# Patient Record
Sex: Male | Born: 1950 | Race: White | Hispanic: No | Marital: Married | Smoking: Never smoker
Health system: Southern US, Community
[De-identification: ages and names within clinical notes are randomized; demographics above are authoritative.]

## PROBLEM LIST (undated history)

## (undated) DIAGNOSIS — B029 Zoster without complications: Secondary | ICD-10-CM

## (undated) DIAGNOSIS — I10 Essential (primary) hypertension: Secondary | ICD-10-CM

## (undated) HISTORY — DX: Essential (primary) hypertension: I10

## (undated) HISTORY — PX: NO PAST SURGERIES: SHX2092

## (undated) HISTORY — DX: Zoster without complications: B02.9

---

## 2011-10-11 ENCOUNTER — Other Ambulatory Visit: Payer: Self-pay | Admitting: Ophthalmology

## 2012-07-11 ENCOUNTER — Ambulatory Visit (INDEPENDENT_AMBULATORY_CARE_PROVIDER_SITE_OTHER): Payer: Self-pay | Admitting: Family Medicine

## 2012-07-11 VITALS — BP 233/112 | HR 79 | Temp 98.2°F | Wt 202.8 lb

## 2012-07-11 DIAGNOSIS — H6692 Otitis media, unspecified, left ear: Secondary | ICD-10-CM

## 2012-07-11 DIAGNOSIS — E8881 Metabolic syndrome: Secondary | ICD-10-CM

## 2012-07-11 DIAGNOSIS — H6690 Otitis media, unspecified, unspecified ear: Secondary | ICD-10-CM

## 2012-07-11 DIAGNOSIS — J039 Acute tonsillitis, unspecified: Secondary | ICD-10-CM

## 2012-07-11 DIAGNOSIS — I10 Essential (primary) hypertension: Secondary | ICD-10-CM

## 2012-07-11 MED ORDER — AMOXICILLIN 875 MG PO TABS: 875.0000 mg | ORAL_TABLET | Freq: Two times a day (BID) | ORAL | Status: DC

## 2012-07-11 MED ORDER — AMLODIPINE BESYLATE 5 MG PO TABS: 5.0000 mg | ORAL_TABLET | Freq: Every day | ORAL | Status: DC

## 2012-07-11 NOTE — Progress Notes (Signed)
Patient ID: [name], male   DOB: 05/06/1950, 62 y.o.   MRN: [id number] SUBJECTIVE: Chief Complaint  Patient presents with  . Acute Visit    c/o tonsils swollen discomfort left ear    HPI: Has had large tonsils for years. Dr Christell Constant had referred him to ENT and the specialist had said it was risky. Gets recurrent tonsillitis. Having left sided tonsil pain and left ear feels styffed up. Nose congested. No fever.. No headache .  PMH/PSH: reviewed/updated in Epic  SH/FH: reviewed/updated in Epic: mom died at 33 years old, dad died at 69 years of alzheimers disease  Allergies: reviewed/updated in Epic  Medications: reviewed/updated in Epic  Immunizations: reviewed/updated in Epic  ROS: As above in the HPI. All other systems are stable or negative.  OBJECTIVE: APPEARANCE:  Patient in no acute distress.The patient appeared well nourished and normally developed. Acyanotic. Waist:42 3/4 inches VITAL SIGNS:BP 233/112  Pulse 79  Temp(Src) 98.2 F (36.8 C) (Oral)  Wt 202 lb 12.8 oz (91.989 kg) WM Obese   SKIN: warm and  Dry without overt rashes, tattoos and scars  HEAD and Neck: without JVD, Head and scalp: normal Eyes:No scleral icterus. Fundi normal, eye movements normal. Ears: Auricle normal, canal normal, left Tympanic membrane is abnormal, effusion and gray appearing, insufflation absent motion on the left. Nose: normal Throat:tonsillar hypertrophyNeck & thyroid: normal  CHEST & LUNGS: Chest wall: normal Lungs: Clear  CVS: Reveals the PMI to be normally located. Regular rhythm, First and Second Heart sounds are normal,  absence of murmurs, rubs or gallops. Peripheral vasculature: Radial pulses: normal Dorsal pedis pulses: normal Posterior pulses: normal  ABDOMEN:  Appearance: obese Benign, no organomegaly, no masses, no Abdominal Aortic enlargement. No Guarding , no rebound. No Bruits. Bowel sounds: normal  RECTAL: N/A GU: N/A  EXTREMETIES:  nonedematous. Both Femoral and Pedal pulses are normal.  MUSCULOSKELETAL:  Spine: normal Joints: intact  NEUROLOGIC: oriented to time,place and person; nonfocal. Strength is normal Sensory is normal Reflexes are normal Cranial Nerves are normal.  ASSESSMENT: Acute tonsillitis - Plan: amoxicillin (AMOXIL) 875 MG tablet  Otitis media, left - Plan: amoxicillin (AMOXIL) 875 MG tablet  HTN (hypertension) - Plan: amLODipine (NORVASC) 5 MG tablet  Metabolic syndrome  PLAN: No orders of the defined types were placed in this encounter.   No results found for this or any previous visit. Meds ordered this encounter  Medications  . amoxicillin (AMOXIL) 875 MG tablet    Sig: Take 1 tablet (875 mg total) by mouth 2 (two) times daily.    Dispense:  20 tablet    Refill:  0  . amLODipine (NORVASC) 5 MG tablet    Sig: Take 1 tablet (5 mg total) by mouth daily.    Dispense:  30 tablet    Refill:  3   DASH Diet handout in the AVS.       Dr Woodroe Mode Recommendations  Diet and Exercise discussed with patient.  For nutrition information, I recommend books:  1).Eat to Live by Dr Monico Hoar. 2).Prevent and Reverse Heart Disease by Dr Suzzette Righter. 3) Dr Katherina Right Book: Reversing Diabetes  Exercise recommendations are:  If unable to walk, then the patient can exercise in a chair 3 times a day. By flapping arms like a bird gently and raising legs outwards to the front.  If ambulatory, the patient can go for walks for 30 minutes 3 times a week. Then increase the intensity and duration as tolerated.  Goal  is to try to attain exercise frequency to 5 times a week.  If applicable: Best to perform resistance exercises (machines or weights) 2 days a week and cardio type exercises 3 days per week.   Return in about 1 week (around 07/18/2012) for Recheck medical problems, recheck BP.   Khaliya Golinski P. [place] Charon, M.D.

## 2012-07-11 NOTE — Patient Instructions (Addendum)
Dr Woodroe Mode Recommendations  Diet and Exercise discussed with patient.  For nutrition information, I recommend books:  1).Eat to Live by Dr Monico Hoar. 2).Prevent and Reverse Heart Disease by Dr Suzzette Righter. 3) Dr Katherina Right Book: Reversing Diabetes  Exercise recommendations are:  If unable to walk, then the patient can exercise in a chair 3 times a day. By flapping arms like a bird gently and raising legs outwards to the front.  If ambulatory, the patient can go for walks for 30 minutes 3 times a week. Then increase the intensity and duration as tolerated.  Goal is to try to attain exercise frequency to 5 times a week.  If applicable: Best to perform resistance exercises (machines or weights) 2 days a week and cardio type exercises 3 days per week.   DASH Diet The DASH diet stands for "Dietary Approaches to Stop Hypertension." It is a healthy eating plan that has been shown to reduce high blood pressure (hypertension) in as little as 14 days, while also possibly providing other significant health benefits. These other health benefits include reducing the risk of breast cancer after menopause and reducing the risk of type 2 diabetes, heart disease, colon cancer, and stroke. Health benefits also include weight loss and slowing kidney failure in patients with chronic kidney disease.  DIET GUIDELINES  Limit salt (sodium). Your diet should contain less than 1500 mg of sodium daily.  Limit refined or processed carbohydrates. Your diet should include mostly whole grains. Desserts and added sugars should be used sparingly.  Include small amounts of heart-healthy fats. These types of fats include nuts, oils, and tub margarine. Limit saturated and trans fats. These fats have been shown to be harmful in the body. CHOOSING FOODS  The following food groups are based on a 2000 calorie diet. See your Registered Dietitian for individual calorie needs. Grains and Grain  Products (6 to 8 servings daily)  Eat More Often: Whole-wheat bread, brown rice, whole-grain or wheat pasta, quinoa, popcorn without added fat or salt (air popped).  Eat Less Often: White bread, white pasta, white rice, cornbread. Vegetables (4 to 5 servings daily)  Eat More Often: Fresh, frozen, and canned vegetables. Vegetables may be raw, steamed, roasted, or grilled with a minimal amount of fat.  Eat Less Often/Avoid: Creamed or fried vegetables. Vegetables in a cheese sauce. Fruit (4 to 5 servings daily)  Eat More Often: All fresh, canned (in natural juice), or frozen fruits. Dried fruits without added sugar. One hundred percent fruit juice ( cup [237 mL] daily).  Eat Less Often: Dried fruits with added sugar. Canned fruit in light or heavy syrup. [employer], Fish, and Poultry (2 servings or less daily. One serving is 3 to 4 oz [85-114 g]).  Eat More Often: Ninety percent or leaner ground beef, tenderloin, sirloin. Round cuts of beef, chicken breast, [place] breast. All fish. Grill, bake, or broil your meat. Nothing should be fried.  Eat Less Often/Avoid: Fatty cuts of meat, [place], or chicken leg, thigh, or wing. Fried cuts of meat or fish. Dairy (2 to 3 servings)  Eat More Often: Low-fat or fat-free milk, low-fat plain or light yogurt, reduced-fat or part-skim cheese.  Eat Less Often/Avoid: Milk (whole, 2%).Whole milk yogurt. Full-fat cheeses. Nuts, Seeds, and Legumes (4 to 5 servings per week)  Eat More Often: All without added salt.  Eat Less Often/Avoid: Salted nuts and seeds, canned beans with added salt. Fats and Sweets (limited)  Eat More  Often: Vegetable oils, tub margarines without trans fats, sugar-free gelatin. Mayonnaise and salad dressings.  Eat Less Often/Avoid: Coconut oils, palm oils, butter, stick margarine, cream, half and half, cookies, candy, pie. FOR MORE INFORMATION The Dash Diet Eating Plan: www.dashdiet.org Document Released: 01/10/2011 Document  Revised: 04/15/2011 Document Reviewed: 01/10/2011 [facility] Patient Information 2014 [place], [place].

## 2012-07-17 ENCOUNTER — Ambulatory Visit (INDEPENDENT_AMBULATORY_CARE_PROVIDER_SITE_OTHER): Payer: PRIVATE HEALTH INSURANCE | Admitting: Family Medicine

## 2012-07-17 ENCOUNTER — Encounter: Payer: Self-pay | Admitting: Family Medicine

## 2012-07-17 VITALS — BP 184/105 | HR 69 | Temp 97.8°F | Ht 71.0 in | Wt 202.0 lb

## 2012-07-17 DIAGNOSIS — I10 Essential (primary) hypertension: Secondary | ICD-10-CM

## 2012-07-17 MED ORDER — AMLODIPINE BESYLATE 10 MG PO TABS: 10.0000 mg | ORAL_TABLET | Freq: Every day | ORAL | Status: DC

## 2012-07-17 NOTE — Progress Notes (Signed)
  Subjective:    Patient ID: [name], male    DOB: 11-08-1950, 62 y.o.   MRN: [id number]  HPI    Review of Systems     Objective:   Physical Exam        Assessment & Plan:

## 2012-07-21 ENCOUNTER — Ambulatory Visit: Payer: PRIVATE HEALTH INSURANCE | Admitting: Family Medicine

## 2012-07-22 MED ORDER — AMLODIPINE BESYLATE 10 MG PO TABS: 10.0000 mg | ORAL_TABLET | Freq: Every day | ORAL | Status: DC

## 2012-07-22 NOTE — Addendum Note (Signed)
Addended by: Deatra Canter on: 07/22/2012 03:09 PM   Modules accepted: Orders, Medications

## 2012-07-22 NOTE — Progress Notes (Addendum)
  Subjective:    Patient ID: [name], male    DOB: 12/11/1950, 62 y.o.   MRN: [id number]  HPI This 62 y.o. male presents for evaluation of Hypertension. Patient has hx of htn and is having elevated BP.    Review of Systems No chest pain, SOB, HA, dizziness, vision change, N/V, diarrhea, constipation, dysuria, urinary urgency or frequency, myalgias, arthralgias or rash.     Objective:   Physical Exam Vital signs noted  Well developed well nourished male.  HEENT - Head atraumatic Normocephalic                Eyes - PERRLA, Conjuctiva - clear Sclera- Clear EOMI                Ears - EAC's Wnl TM's Wnl Gross Hearing WNL                Nose - Nares patent                 Throat - oropharanx wnl Respiratory - Lungs CTA bilateral Cardiac - RRR S1 and S2 without murmur GI - Abdomen soft Nontender and bowel sounds active x 4 Extremities - No edema. Neuro - Grossly intact.       Assessment & Plan:  Essential hypertension, benign -   Amlodipine 10mg  one po qd and follow up in one to two weeks or prn

## 2012-08-17 ENCOUNTER — Encounter: Payer: Self-pay | Admitting: Family Medicine

## 2012-08-17 ENCOUNTER — Ambulatory Visit (INDEPENDENT_AMBULATORY_CARE_PROVIDER_SITE_OTHER): Payer: PRIVATE HEALTH INSURANCE | Admitting: Family Medicine

## 2012-08-17 VITALS — BP 161/90 | HR 80 | Temp 97.5°F | Ht 71.0 in | Wt 202.6 lb

## 2012-08-17 DIAGNOSIS — H6983 Other specified disorders of Eustachian tube, bilateral: Secondary | ICD-10-CM

## 2012-08-17 DIAGNOSIS — H6980 Other specified disorders of Eustachian tube, unspecified ear: Secondary | ICD-10-CM

## 2012-08-17 DIAGNOSIS — I10 Essential (primary) hypertension: Secondary | ICD-10-CM

## 2012-08-17 MED ORDER — HYDROCHLOROTHIAZIDE 12.5 MG PO CAPS: 12.5000 mg | ORAL_CAPSULE | Freq: Every day | ORAL | Status: DC

## 2012-08-17 NOTE — Progress Notes (Signed)
  Subjective:    Patient ID: [name], male    DOB: 10-29-1950, 62 y.o.   MRN: [id number]  HPI This 62 y.o. male presents for evaluation of hypertension.  He was having elevated bp And his amlodipine was increased to 10mg  po qd and he states he is getting bp readings of 150/90's consistently at home.  He is also having some congestion And ear discomfort.  He has had OM and URI recently and was on abx's..   Review of Systems C/o ear discomfort.    No chest pain, SOB, HA, dizziness, vision change, N/V, diarrhea, constipation, dysuria, urinary urgency or frequency, myalgias, arthralgias or rash.  Objective:   Physical Exam Vital signs noted  Well developed well nourished male.  HEENT - Head atraumatic Normocephalic                Eyes - PERRLA, Conjuctiva - clear Sclera- Clear EOMI                Ears - EAC's Wnl TM's Wnl Gross Hearing WNL                Nose - Nares patent                 Throat - oropharanx wnl Respiratory - Lungs CTA bilateral Cardiac - RRR S1 and S2 without murmur GI - Abdomen soft Nontender and bowel sounds active x 4 Extremities - No edema. Neuro - Grossly intact.       Assessment & Plan:  Essential hypertension, benign - Plan: hydrochlorothiazide (MICROZIDE) 12.5 MG capsule Follow up in one month for recheck and bmp.  Eustachian tube dysfunction, bilateral - Nasonex Nasal spray 1-2 sprays per nostril qd and continue  Doing valsalva maneuver.

## 2012-09-17 ENCOUNTER — Ambulatory Visit (INDEPENDENT_AMBULATORY_CARE_PROVIDER_SITE_OTHER): Payer: PRIVATE HEALTH INSURANCE | Admitting: Family Medicine

## 2012-09-17 ENCOUNTER — Encounter: Payer: Self-pay | Admitting: Family Medicine

## 2012-09-17 VITALS — BP 137/82 | HR 70 | Temp 97.8°F | Ht 71.0 in | Wt 198.6 lb

## 2012-09-17 DIAGNOSIS — H6990 Unspecified Eustachian tube disorder, unspecified ear: Secondary | ICD-10-CM

## 2012-09-17 DIAGNOSIS — H6980 Other specified disorders of Eustachian tube, unspecified ear: Secondary | ICD-10-CM

## 2012-09-17 DIAGNOSIS — I10 Essential (primary) hypertension: Secondary | ICD-10-CM

## 2012-09-17 MED ORDER — FLUTICASONE PROPIONATE 50 MCG/ACT NA SUSP: 2.0000 | Freq: Every day | NASAL | Status: DC

## 2012-09-18 NOTE — Addendum Note (Signed)
Addended by: Deatra Canter on: 09/18/2012 08:07 AM   Modules accepted: Level of Service

## 2012-09-18 NOTE — Patient Instructions (Signed)

## 2012-09-18 NOTE — Progress Notes (Signed)
  Subjective:    Patient ID: [name], male    DOB: October 14, 1950, 62 y.o.   MRN: [id number]  HPI This 62 y.o. male presents for evaluation of establishment visit and hypertension.  He has Hx of cva and is accompanied by his daughter who states he has been out of his blood pressure Medicine for 3 months.  She states he in numb on his right side from CVA. Daughter states he May have had some more strokes. Patient is dysarthric.   Review of Systems C/o HTN No chest pain, SOB, HA, dizziness, vision change, N/V, diarrhea, constipation, dysuria, urinary urgency or frequency, myalgias, arthralgias or rash.     Objective:   Physical Exam Vital signs noted  Chronic ill appearing  male.  HEENT - Head atraumatic Normocephalic                Eyes - PERRLA, Conjuctiva - clear Sclera- Clear EOMI                Ears - EAC's Wnl TM's Wnl Gross Hearing WNL                Nose - Nares patent                 Throat - poor dentition Respiratory - Lungs CTA bilateral Cardiac - RRR S1 and S2 without murmur BP right arm 220/120       Assessment & Plan:  Essential hypertension, benign - Plan: BMP8+EGFR - Discuss with patient and daughter that he  Needs to go to ED for hypertensive crisis.   EMS responds and takes patient to the ER at [facility].  Eustachian tube dysfunction, unspecified laterality - Plan: fluticasone (FLONASE) 50 MCG/ACT nasal spray

## 2012-09-18 NOTE — Progress Notes (Signed)
  Subjective:    Patient ID: [name], male    DOB: 06/12/1950, 62 y.o.   MRN: [id number]  HPI Previous note was wrong patient.  This 62 y.o. male presents for evaluation of hypertension.  Patient recently was rx'd hctz 12.5mg  For HTN and is tolerating it well and is needing refill on flonase for ETD and rhinitis and needs BMP. His bp is doing better.   Review of Systems No chest pain, SOB, HA, dizziness, vision change, N/V, diarrhea, constipation, dysuria, urinary urgency or frequency, myalgias, arthralgias or rash.     Objective:   Physical Exam  Vital signs noted  Well developed well nourished male.  HEENT - Head atraumatic Normocephalic                Eyes - PERRLA, Conjuctiva - clear Sclera- Clear EOMI                Ears - EAC's Wnl TM's Wnl Gross Hearing WNL                Nose - Nares patent                 Throat - oropharanx wnl Respiratory - Lungs CTA bilateral Cardiac - RRR S1 and S2 without murmur GI - Abdomen soft Nontender and bowel sounds active x 4 Extremities - No edema. Neuro - Grossly intact.      Assessment & Plan:  Essential hypertension, benign - Plan: BMP8+EGFR.  Blood pressure is controlled and follow up in 3 months.  Eustachian tube dysfunction, unspecified laterality - Plan: fluticasone (FLONASE) 50 MCG/ACT nasal spray

## 2012-12-23 ENCOUNTER — Ambulatory Visit (INDEPENDENT_AMBULATORY_CARE_PROVIDER_SITE_OTHER): Payer: PRIVATE HEALTH INSURANCE | Admitting: Family Medicine

## 2012-12-23 DIAGNOSIS — I10 Essential (primary) hypertension: Secondary | ICD-10-CM

## 2013-01-11 NOTE — Patient Instructions (Signed)

## 2013-01-11 NOTE — Progress Notes (Signed)
   Subjective:    Patient ID: [name], male    DOB: [birth year], 62 y.o.   MRN: [id number]  HPI This 62 y.o. male presents for evaluation of periodic evaluation. He has hx Of hypertension.   Review of Systems No chest pain, SOB, HA, dizziness, vision change, N/V, diarrhea, constipation, dysuria, urinary urgency or frequency, myalgias, arthralgias or rash.     Objective:   Physical Exam Vital signs noted  Well developed well nourished male.  HEENT - Head atraumatic Normocephalic                Eyes - PERRLA, Conjuctiva - clear Sclera- Clear EOMI                Ears - EAC's Wnl TM's Wnl Gross Hearing WNL                Nose - Nares patent                 Throat - oropharanx wnl Respiratory - Lungs CTA bilateral Cardiac - RRR S1 and S2 without murmur GI - Abdomen soft Nontender and bowel sounds active x 4 Extremities - No edema. Neuro - Grossly intact.       Assessment & Plan:  Essential hypertension, benign Continue current blood pressure medicines  Deatra Canter FNP

## 2013-03-11 ENCOUNTER — Ambulatory Visit (INDEPENDENT_AMBULATORY_CARE_PROVIDER_SITE_OTHER): Payer: PRIVATE HEALTH INSURANCE | Admitting: Nurse Practitioner

## 2013-03-11 ENCOUNTER — Encounter: Payer: Self-pay | Admitting: Nurse Practitioner

## 2013-03-11 ENCOUNTER — Telehealth: Payer: Self-pay | Admitting: Family Medicine

## 2013-03-11 VITALS — BP 137/83 | HR 88 | Temp 101.0°F | Ht 71.0 in | Wt 198.0 lb

## 2013-03-11 DIAGNOSIS — R0989 Other specified symptoms and signs involving the circulatory and respiratory systems: Secondary | ICD-10-CM

## 2013-03-11 DIAGNOSIS — J101 Influenza due to other identified influenza virus with other respiratory manifestations: Secondary | ICD-10-CM

## 2013-03-11 DIAGNOSIS — J111 Influenza due to unidentified influenza virus with other respiratory manifestations: Secondary | ICD-10-CM

## 2013-03-11 LAB — POCT INFLUENZA A/B
INFLUENZA B, POC: POSITIVE
Influenza A, POC: NEGATIVE

## 2013-03-11 MED ORDER — OSELTAMIVIR PHOSPHATE 75 MG PO CAPS: 75.0000 mg | ORAL_CAPSULE | Freq: Two times a day (BID) | ORAL | Status: DC

## 2013-03-11 MED ORDER — HYDROCODONE-HOMATROPINE 5-1.5 MG/5ML PO SYRP: 5.0000 mL | ORAL_SOLUTION | Freq: Three times a day (TID) | ORAL | Status: DC | PRN

## 2013-03-11 NOTE — Telephone Encounter (Signed)
Bodyaches and cough. Appt scheduled for this afternoon.

## 2013-03-11 NOTE — Patient Instructions (Signed)

## 2013-03-11 NOTE — Progress Notes (Signed)
Subjective:    Patient ID: [name], male    DOB: [birth year], 63 y.o.   MRN: [id number]  HPI PAtient in c/o cough, congestion and body aches that started Tuesday evenng all the sudden- fever developed yesterday- OTC motrin for fever.    Review of Systems  Constitutional: Positive for fever and chills. Negative for appetite change.  HENT: Positive for congestion and rhinorrhea. Negative for sore throat and trouble swallowing.   Respiratory: Positive for cough (nonproductive).   Cardiovascular: Negative.   Genitourinary: Negative.   All other systems reviewed and are negative.       Objective:   Physical Exam  Constitutional: He is oriented to person, place, and time. He appears well-developed and well-nourished.  HENT:  Right Ear: Hearing, tympanic membrane, external ear and ear canal normal.  Left Ear: Hearing, tympanic membrane, external ear and ear canal normal.  Nose: Mucosal edema and rhinorrhea present. Right sinus exhibits no maxillary sinus tenderness and no frontal sinus tenderness. Left sinus exhibits no maxillary sinus tenderness and no frontal sinus tenderness.  Mouth/Throat: Uvula is midline, oropharynx is clear and moist and mucous membranes are normal.  Eyes: Pupils are equal, round, and reactive to light.  Neck: Normal range of motion. Neck supple.  Cardiovascular: Normal rate, regular rhythm and normal heart sounds.   Pulmonary/Chest: Effort normal and breath sounds normal.  Dry cough  Abdominal: Soft. Bowel sounds are normal.  Lymphadenopathy:    He has no cervical adenopathy.  Neurological: He is alert and oriented to person, place, and time.  Skin: Skin is warm and dry.  Psychiatric: He has a normal mood and affect. His behavior is normal. Judgment and thought content normal.    BP 137/83  Pulse 88  Temp(Src) 101 F (38.3 C) (Oral)  Ht 5\' 11"  (1.803 m)  Wt 198 lb (89.812 kg)  BMI 27.63 kg/m2  Results for orders placed in visit on 03/11/13    POCT INFLUENZA A/B      Result Value Range   Influenza A, POC Negative     Influenza B, POC Positive          Assessment & Plan:   1. Chest congestion   2. Influenza B    Meds ordered this encounter  Medications  . oseltamivir (TAMIFLU) 75 MG capsule    Sig: Take 1 capsule (75 mg total) by mouth 2 (two) times daily.    Dispense:  10 capsule    Refill:  0    Order Specific Question:  Supervising Provider    Answer:  Ernestina Penna [[id number]]  . HYDROcodone-homatropine (HYCODAN) 5-1.5 MG/5ML syrup    Sig: Take 5 mLs by mouth every 8 (eight) hours as needed for cough.    Dispense:  120 mL    Refill:  0    Order Specific Question:  Supervising Provider    Answer:  Ernestina Penna [[id number]]   1. Take meds as prescribed 2. Use a cool mist humidifier especially during the winter months and when heat has been humid. 3. Use saline nose sprays frequently 4. Saline irrigations of the nose can be very helpful if done frequently.  * 4X daily for 1 week*  * Use of a nettie pot can be helpful with this. Follow directions with this* 5. Drink plenty of fluids 6. Keep thermostat turn down low 7.For any cough or congestion  Use plain Mucinex- regular strength or max strength is fine   * Children- consult with Pharmacist  for dosing 8. For fever or aces or pains- take tylenol or ibuprofen appropriate for age and weight.  * for fevers greater than 101 orally you may alternate ibuprofen and tylenol every  3 hours.   Mary-Margaret Daphine DeutscherMartin, FNP

## 2013-03-25 ENCOUNTER — Encounter: Payer: Self-pay | Admitting: Family Medicine

## 2013-03-25 ENCOUNTER — Ambulatory Visit (INDEPENDENT_AMBULATORY_CARE_PROVIDER_SITE_OTHER): Payer: PRIVATE HEALTH INSURANCE | Admitting: Family Medicine

## 2013-03-25 VITALS — BP 119/73 | HR 54 | Temp 97.7°F | Ht 71.0 in | Wt 195.0 lb

## 2013-03-25 DIAGNOSIS — I10 Essential (primary) hypertension: Secondary | ICD-10-CM

## 2013-03-25 NOTE — Progress Notes (Signed)
   Subjective:    Patient ID: [name], male    DOB: [birth year], 63 y.o.   MRN: [id number]  HPI This 63 y.o. male presents for evaluation of hypertension.  He is feeling better and his bp Is better controlled.   Review of Systems No chest pain, SOB, HA, dizziness, vision change, N/V, diarrhea, constipation, dysuria, urinary urgency or frequency, myalgias, arthralgias or rash.     Objective:   Physical Exam  Vital signs noted  Well developed well nourished male.  HEENT - Head atraumatic Normocephalic                Eyes - PERRLA, Conjuctiva - clear Sclera- Clear EOMI                Ears - EAC's Wnl TM's Wnl Gross Hearing WNL                Nose - Nares patent                 Throat - oropharanx wnl Respiratory - Lungs CTA bilateral Cardiac - RRR S1 and S2 without murmur GI - Abdomen soft Nontender and bowel sounds active x 4 Extremities - No edema. Neuro - Grossly intact.      Assessment & Plan:  Essential hypertension, benign Continue current regimen and follow up in 6 months  Deatra CanterWilliam J Oxford FNP

## 2013-07-17 ENCOUNTER — Other Ambulatory Visit: Payer: Self-pay | Admitting: Family Medicine

## 2013-07-19 ENCOUNTER — Other Ambulatory Visit: Payer: Self-pay | Admitting: *Deleted

## 2013-07-19 DIAGNOSIS — I10 Essential (primary) hypertension: Secondary | ICD-10-CM

## 2013-07-19 MED ORDER — HYDROCHLOROTHIAZIDE 12.5 MG PO CAPS: 12.5000 mg | ORAL_CAPSULE | Freq: Every day | ORAL | Status: DC

## 2013-07-19 NOTE — Telephone Encounter (Signed)
Last refill 2/15. 

## 2013-07-19 NOTE — Telephone Encounter (Signed)
Last ov 2/29/15. Pt wants #90.

## 2013-08-12 ENCOUNTER — Other Ambulatory Visit: Payer: Self-pay | Admitting: *Deleted

## 2013-08-12 NOTE — Telephone Encounter (Signed)
Last ov 2/15. 

## 2013-08-13 MED ORDER — AMLODIPINE BESYLATE 10 MG PO TABS: 10.0000 mg | ORAL_TABLET | Freq: Every day | ORAL | Status: DC

## 2013-09-18 ENCOUNTER — Other Ambulatory Visit: Payer: Self-pay | Admitting: Family Medicine

## 2013-10-01 ENCOUNTER — Encounter: Payer: Self-pay | Admitting: Family Medicine

## 2013-10-01 ENCOUNTER — Ambulatory Visit (INDEPENDENT_AMBULATORY_CARE_PROVIDER_SITE_OTHER): Payer: PRIVATE HEALTH INSURANCE | Admitting: Family Medicine

## 2013-10-01 VITALS — BP 127/75 | HR 68 | Temp 98.5°F | Ht 71.0 in | Wt 201.0 lb

## 2013-10-01 DIAGNOSIS — M129 Arthropathy, unspecified: Secondary | ICD-10-CM

## 2013-10-01 DIAGNOSIS — J309 Allergic rhinitis, unspecified: Secondary | ICD-10-CM

## 2013-10-01 DIAGNOSIS — J302 Other seasonal allergic rhinitis: Secondary | ICD-10-CM

## 2013-10-01 DIAGNOSIS — M1990 Unspecified osteoarthritis, unspecified site: Secondary | ICD-10-CM

## 2013-10-01 DIAGNOSIS — I10 Essential (primary) hypertension: Secondary | ICD-10-CM

## 2013-10-01 MED ORDER — FLUTICASONE PROPIONATE 50 MCG/ACT NA SUSP: 2.0000 | Freq: Every day | NASAL | Status: DC

## 2013-10-01 MED ORDER — AMLODIPINE BESYLATE 10 MG PO TABS: 10.0000 mg | ORAL_TABLET | Freq: Every day | ORAL | Status: DC

## 2013-10-01 MED ORDER — LISINOPRIL 10 MG PO TABS: 10.0000 mg | ORAL_TABLET | Freq: Every day | ORAL | Status: DC

## 2013-10-01 MED ORDER — MELOXICAM 7.5 MG PO TABS: 7.5000 mg | ORAL_TABLET | Freq: Every day | ORAL | Status: DC

## 2013-10-01 MED ORDER — HYDROCHLOROTHIAZIDE 12.5 MG PO CAPS: 12.5000 mg | ORAL_CAPSULE | Freq: Every day | ORAL | Status: DC

## 2013-10-01 NOTE — Progress Notes (Signed)
   Subjective:    Patient ID: [name], male    DOB: [birth year], 62 y.o.   MRN: [id number]  HPI  This 63 y.o. male presents for evaluation of routine follow up.  He has hx of seasonal allergies And hypertension.  Review of Systems No chest pain, SOB, HA, dizziness, vision change, N/V, diarrhea, constipation, dysuria, urinary urgency or frequency, myalgias, arthralgias or rash.     Objective:   Physical Exam Vital signs noted  Well developed well nourished male.  HEENT - Head atraumatic Normocephalic                Eyes - PERRLA, Conjuctiva - clear Sclera- Clear EOMI                Ears - EAC's Wnl TM's Wnl Gross Hearing WNL                Nose - Nares patent                 Throat - oropharanx wnl Respiratory - Lungs CTA bilateral Cardiac - RRR S1 and S2 without murmur GI - Abdomen soft Nontender and bowel sounds active x 4 Extremities - No edema. Neuro - Grossly intact.       Assessment & Plan:  Essential hypertension, benign - Plan: amLODipine (NORVASC) 10 MG tablet, hydrochlorothiazide (MICROZIDE) 12.5 MG capsule, lisinopril (PRINIVIL,ZESTRIL) 10 MG tablet  Seasonal allergies - Plan: fluticasone (FLONASE) 50 MCG/ACT nasal spray  Arthritis - Plan: meloxicam (MOBIC) 7.5 MG tablet  Refuses labs Deatra Canter FNP

## 2013-11-08 ENCOUNTER — Other Ambulatory Visit: Payer: Self-pay | Admitting: Family Medicine

## 2013-12-29 ENCOUNTER — Telehealth: Payer: Self-pay | Admitting: Family Medicine

## 2013-12-29 NOTE — Telephone Encounter (Signed)
Appointment given for Friday with [name]. Daughter states that this pain has been going on for about 2 weeks.

## 2013-12-31 ENCOUNTER — Ambulatory Visit: Payer: PRIVATE HEALTH INSURANCE | Admitting: Family Medicine

## 2014-05-01 ENCOUNTER — Other Ambulatory Visit: Payer: Self-pay | Admitting: Family Medicine

## 2014-06-01 ENCOUNTER — Other Ambulatory Visit: Payer: Self-pay | Admitting: Family Medicine

## 2014-06-29 ENCOUNTER — Other Ambulatory Visit: Payer: Self-pay | Admitting: Family Medicine

## 2014-10-19 ENCOUNTER — Other Ambulatory Visit: Payer: Self-pay | Admitting: Nurse Practitioner

## 2014-10-19 ENCOUNTER — Other Ambulatory Visit: Payer: Self-pay

## 2014-10-19 DIAGNOSIS — I10 Essential (primary) hypertension: Secondary | ICD-10-CM

## 2014-10-19 MED ORDER — LISINOPRIL 10 MG PO TABS: 10.0000 mg | ORAL_TABLET | Freq: Every day | ORAL | Status: DC

## 2014-10-19 NOTE — Telephone Encounter (Signed)
Last seen 10/01/13  B Oxford  Requesting 90 day supply

## 2014-11-20 ENCOUNTER — Other Ambulatory Visit: Payer: Self-pay | Admitting: Family Medicine

## 2014-11-21 NOTE — Telephone Encounter (Signed)
Last seen 09/2013 

## 2014-12-22 ENCOUNTER — Other Ambulatory Visit: Payer: Self-pay

## 2014-12-22 DIAGNOSIS — I10 Essential (primary) hypertension: Secondary | ICD-10-CM

## 2014-12-26 ENCOUNTER — Other Ambulatory Visit: Payer: Self-pay | Admitting: Family Medicine

## 2015-01-28 ENCOUNTER — Other Ambulatory Visit: Payer: Self-pay | Admitting: Family Medicine

## 2015-02-03 ENCOUNTER — Other Ambulatory Visit: Payer: Self-pay | Admitting: *Deleted

## 2015-02-03 DIAGNOSIS — I10 Essential (primary) hypertension: Secondary | ICD-10-CM

## 2015-02-03 MED ORDER — LISINOPRIL 10 MG PO TABS: 10.0000 mg | ORAL_TABLET | Freq: Every day | ORAL | Status: DC

## 2015-02-03 MED ORDER — AMLODIPINE BESYLATE 10 MG PO TABS: 10.0000 mg | ORAL_TABLET | Freq: Every day | ORAL | Status: DC

## 2015-02-03 MED ORDER — HYDROCHLOROTHIAZIDE 12.5 MG PO CAPS: 12.5000 mg | ORAL_CAPSULE | Freq: Every day | ORAL | Status: DC

## 2015-02-03 NOTE — Telephone Encounter (Signed)
Has appt next Friday,

## 2015-02-06 ENCOUNTER — Other Ambulatory Visit: Payer: Self-pay | Admitting: Nurse Practitioner

## 2015-02-10 ENCOUNTER — Ambulatory Visit (INDEPENDENT_AMBULATORY_CARE_PROVIDER_SITE_OTHER): Payer: PRIVATE HEALTH INSURANCE | Admitting: Family Medicine

## 2015-02-10 ENCOUNTER — Encounter: Payer: Self-pay | Admitting: Family Medicine

## 2015-02-10 VITALS — BP 130/76 | HR 104 | Temp 103.4°F | Ht 71.0 in | Wt 181.4 lb

## 2015-02-10 DIAGNOSIS — J101 Influenza due to other identified influenza virus with other respiratory manifestations: Secondary | ICD-10-CM

## 2015-02-10 DIAGNOSIS — R6883 Chills (without fever): Secondary | ICD-10-CM

## 2015-02-10 DIAGNOSIS — I10 Essential (primary) hypertension: Secondary | ICD-10-CM

## 2015-02-10 DIAGNOSIS — R52 Pain, unspecified: Secondary | ICD-10-CM | POA: Diagnosis not present

## 2015-02-10 DIAGNOSIS — R509 Fever, unspecified: Secondary | ICD-10-CM | POA: Diagnosis not present

## 2015-02-10 LAB — POCT INFLUENZA A/B
Influenza A, POC: POSITIVE — AB
Influenza B, POC: NEGATIVE

## 2015-02-10 MED ORDER — HYDROCODONE-HOMATROPINE 5-1.5 MG/5ML PO SYRP: 5.0000 mL | ORAL_SOLUTION | Freq: Three times a day (TID) | ORAL | Status: DC | PRN

## 2015-02-10 MED ORDER — LISINOPRIL 10 MG PO TABS: 10.0000 mg | ORAL_TABLET | Freq: Every day | ORAL | Status: DC

## 2015-02-10 MED ORDER — AMLODIPINE BESYLATE 10 MG PO TABS: 10.0000 mg | ORAL_TABLET | Freq: Every day | ORAL | Status: DC

## 2015-02-10 MED ORDER — OSELTAMIVIR PHOSPHATE 75 MG PO CAPS: 75.0000 mg | ORAL_CAPSULE | Freq: Two times a day (BID) | ORAL | Status: DC

## 2015-02-10 MED ORDER — HYDROCHLOROTHIAZIDE 12.5 MG PO CAPS
12.5000 mg | ORAL_CAPSULE | Freq: Every day | ORAL | Status: DC
Start: 2015-02-10 — End: 2015-08-08

## 2015-02-10 NOTE — Patient Instructions (Signed)
Come back for fasting labs and a flu shot in 2 weeks  Influenza, Adult Influenza ("the flu") is a viral infection of the respiratory tract. It occurs more often in winter months because people spend more time in close contact with one another. Influenza can make you feel very sick. Influenza easily spreads from person to person (contagious). CAUSES  Influenza is caused by a virus that infects the respiratory tract. You can catch the virus by breathing in droplets from an infected person's cough or sneeze. You can also catch the virus by touching something that was recently contaminated with the virus and then touching your mouth, nose, or eyes. RISKS AND COMPLICATIONS You may be at risk for a more severe case of influenza if you smoke cigarettes, have diabetes, have chronic heart disease (such as heart failure) or lung disease (such as asthma), or if you have a weakened immune system. Elderly people and pregnant women are also at risk for more serious infections. The most common problem of influenza is a lung infection (pneumonia). Sometimes, this problem can require emergency medical care and may be life threatening. SIGNS AND SYMPTOMS  Symptoms typically last 4 to 10 days and may include:  Fever.  Chills.  Headache, body aches, and muscle aches.  Sore throat.  Chest discomfort and cough.  Poor appetite.  Weakness or feeling tired.  Dizziness.  Nausea or vomiting. DIAGNOSIS  Diagnosis of influenza is often made based on your history and a physical exam. A nose or throat swab test can be done to confirm the diagnosis. TREATMENT  In mild cases, influenza goes away on its own. Treatment is directed at relieving symptoms. For more severe cases, your health care provider may prescribe antiviral medicines to shorten the sickness. Antibiotic medicines are not effective because the infection is caused by a virus, not by bacteria. HOME CARE INSTRUCTIONS  Take medicines only as directed by  your health care provider.  Use a cool mist humidifier to make breathing easier.  Get plenty of rest until your temperature returns to normal. This usually takes 3 to 4 days.  Drink enough fluid to keep your urine clear or pale yellow.  Cover yourmouth and nosewhen coughing or sneezing,and wash your handswellto prevent thevirusfrom spreading.  Stay homefromwork orschool untilthe fever is gonefor at least 261full day. PREVENTION  An annual influenza vaccination (flu shot) is the best way to avoid getting influenza. An annual flu shot is now routinely recommended for all adults in the [place] SEEK MEDICAL CARE IF:  You experiencechest pain, yourcough worsens,or you producemore mucus.  Youhave nausea,vomiting, ordiarrhea.  Your fever returns or gets worse. SEEK IMMEDIATE MEDICAL CARE IF:  You havetrouble breathing, you become short of breath,or your skin ornails becomebluish.  You have severe painor stiffnessin the neck.  You develop a sudden headache, or pain in the face or ear.  You have nausea or vomiting that you cannot control. MAKE SURE YOU:   Understand these instructions.  Will watch your condition.  Will get help right away if you are not doing well or get worse.   This information is not intended to replace advice given to you by your health care provider. Make sure you discuss any questions you have with your health care provider.   Document Released: 01/19/2000 Document Revised: 02/11/2014 Document Reviewed: 04/22/2011 Elsevier Interactive Patient Education [employer]2016 Elsevier Inc.

## 2015-02-10 NOTE — Progress Notes (Signed)
HPI  Patient presents today here for hypertension follow-up and acute visit.  Flulike symptoms Patient spines over the last 2 days he's had severe body aches, chills, malaise, and intermittent fever. He also has cough. He denies dyspnea and chest pain. He's tolerating food and fluids easily. 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  Hypertension Good medication compliance No chest pain, palpitations, leg edema, dyspnea   PMH: Smoking status noted ROS: Per HPI  Objective: BP 130/76 mmHg  Pulse 104  Temp(Src) 103.4 F (39.7 C) (Oral)  Ht 5' 11" (1.803 m)  Wt 181 lb 6.4 oz (82.283 kg)  BMI 25.31 kg/m2 Gen: NAD, alert, cooperative with exam HEENT: NCAT, conjunctiva injected CV: RRR, good S1/S2, no murmur Resp: CTABL, no wheezes, non-labored Ext: No edema, warm Neuro: Alert and oriented, No gross deficits  Assessment and plan:  # Influenza Rapid flu was positive for influenza a Treating with Tamiflu within 48 hours of onset Hycodan syrup given for cough and body aches. Recommended Tylenol Push fluids  # Hypertension Well-controlled Refilled medications Recommended fasting labs in 2 weeks when he feels better    Orders Placed This Encounter  Procedures  . CBC with Differential    Standing Status: Future     Number of Occurrences:      Standing Expiration Date: 02/10/2016  . CMP14+EGFR    Standing Status: Future     Number of Occurrences:      Standing Expiration Date: 02/10/2016  . Lipid Panel    Standing Status: Future     Number of Occurrences:      Standing Expiration Date: 02/10/2016  . TSH    Standing Status: Future     Number of Occurrences:      Standing Expiration Date: 02/10/2016  . POCT Influenza A/B    Meds ordered this encounter  Medications  . oseltamivir (TAMIFLU) 75 MG capsule    Sig: Take 1 capsule (75 mg total) by mouth 2 (two) times daily.   Dispense:  10 capsule    Refill:  0  . HYDROcodone-homatropine (HYCODAN) 5-1.5 MG/5ML syrup    Sig: Take 5 mLs by mouth every 8 (eight) hours as needed for cough.    Dispense:  120 mL    Refill:  0  . amLODipine (NORVASC) 10 MG tablet    Sig: Take 1 tablet (10 mg total) by mouth daily.    Dispense:  90 tablet    Refill:  1  . hydrochlorothiazide (MICROZIDE) 12.5 MG capsule    Sig: Take 1 capsule (12.5 mg total) by mouth daily.    Dispense:  90 capsule    Refill:  1  . lisinopril (PRINIVIL,ZESTRIL) 10 MG tablet    Sig: Take 1 tablet (10 mg total) by mouth daily.    Dispense:  90 tablet    Refill:  [place], MD [facility] 02/10/2015, 5:09 PM

## 2015-02-17 ENCOUNTER — Telehealth: Payer: Self-pay | Admitting: Family Medicine

## 2015-02-17 NOTE — Telephone Encounter (Signed)
It was too early, last filled 02/03/15, pt aware

## 2015-02-25 ENCOUNTER — Other Ambulatory Visit: Payer: PRIVATE HEALTH INSURANCE

## 2015-02-25 ENCOUNTER — Other Ambulatory Visit: Payer: Self-pay | Admitting: Family Medicine

## 2015-02-25 DIAGNOSIS — I10 Essential (primary) hypertension: Secondary | ICD-10-CM

## 2015-02-25 NOTE — Telephone Encounter (Signed)
Spoke with pt and there was a mix up at the pharmacy about when he could get a refill. Pt is calling the pharmacy and letting me know if he needs refill but from our end it looks like he should still have one more refill.

## 2015-02-25 NOTE — Telephone Encounter (Signed)
lmtcb

## 2015-02-26 LAB — CBC WITH DIFFERENTIAL/PLATELET
BASOS ABS: 0 10*3/uL (ref 0.0–0.2)
BASOS: 1 %
EOS (ABSOLUTE): 0.1 10*3/uL (ref 0.0–0.4)
Eos: 2 %
HEMOGLOBIN: 14.3 g/dL (ref 12.6–17.7)
Hematocrit: 41.3 % (ref 37.5–51.0)
IMMATURE GRANS (ABS): 0 10*3/uL (ref 0.0–0.1)
IMMATURE GRANULOCYTES: 0 %
LYMPHS: 22 %
Lymphocytes Absolute: 1.6 10*3/uL (ref 0.7–3.1)
MCH: 30.2 pg (ref 26.6–33.0)
MCHC: 34.6 g/dL (ref 31.5–35.7)
MCV: 87 fL (ref 79–97)
MONOCYTES: 8 %
Monocytes Absolute: 0.6 10*3/uL (ref 0.1–0.9)
NEUTROS ABS: 4.8 10*3/uL (ref 1.4–7.0)
NEUTROS PCT: 67 %
PLATELETS: 262 10*3/uL (ref 150–379)
RBC: 4.73 x10E6/uL (ref 4.14–5.80)
RDW: 13.5 % (ref 12.3–15.4)
WBC: 7.1 10*3/uL (ref 3.4–10.8)

## 2015-02-26 LAB — CMP14+EGFR
ALBUMIN: 4 g/dL (ref 3.6–4.8)
ALT: 15 IU/L (ref 0–44)
AST: 17 IU/L (ref 0–40)
Albumin/Globulin Ratio: 1.5 (ref 1.1–2.5)
Alkaline Phosphatase: 77 IU/L (ref 39–117)
BILIRUBIN TOTAL: 0.8 mg/dL (ref 0.0–1.2)
BUN / CREAT RATIO: 13 (ref 10–22)
BUN: 16 mg/dL (ref 8–27)
CALCIUM: 9.4 mg/dL (ref 8.6–10.2)
CHLORIDE: 101 mmol/L (ref 96–106)
CO2: 24 mmol/L (ref 18–29)
CREATININE: 1.19 mg/dL (ref 0.76–1.27)
GFR, EST AFRICAN AMERICAN: 75 mL/min/{1.73_m2} (ref >59)
GFR, EST NON AFRICAN AMERICAN: 65 mL/min/{1.73_m2} (ref >59)
GLUCOSE: 94 mg/dL (ref 65–99)
Globulin, Total: 2.6 g/dL (ref 1.5–4.5)
Potassium: 4.6 mmol/L (ref 3.5–5.2)
Sodium: 141 mmol/L (ref 134–144)
TOTAL PROTEIN: 6.6 g/dL (ref 6.0–8.5)

## 2015-02-26 LAB — TSH: TSH: 2.93 u[IU]/mL (ref 0.450–4.500)

## 2015-02-26 LAB — LIPID PANEL
CHOL/HDL RATIO: 3.4 ratio (ref 0.0–5.0)
Cholesterol, Total: 121 mg/dL (ref 100–199)
HDL: 36 mg/dL — AB (ref >39)
LDL CALC: 70 mg/dL (ref 0–99)
Triglycerides: 74 mg/dL (ref 0–149)
VLDL CHOLESTEROL CAL: 15 mg/dL (ref 5–40)

## 2015-08-07 ENCOUNTER — Other Ambulatory Visit: Payer: Self-pay | Admitting: Family Medicine

## 2015-08-08 ENCOUNTER — Other Ambulatory Visit: Payer: Self-pay | Admitting: Family Medicine

## 2015-11-03 ENCOUNTER — Other Ambulatory Visit: Payer: Self-pay | Admitting: Family Medicine

## 2015-11-07 ENCOUNTER — Other Ambulatory Visit: Payer: Self-pay | Admitting: Family Medicine

## 2016-01-31 ENCOUNTER — Other Ambulatory Visit: Payer: Self-pay | Admitting: Family Medicine

## 2016-02-03 ENCOUNTER — Other Ambulatory Visit: Payer: Self-pay | Admitting: Family Medicine

## 2016-05-04 ENCOUNTER — Other Ambulatory Visit: Payer: Self-pay | Admitting: Family Medicine

## 2016-08-07 ENCOUNTER — Other Ambulatory Visit: Payer: Self-pay | Admitting: Family Medicine

## 2016-08-08 ENCOUNTER — Telehealth: Payer: Self-pay | Admitting: Family Medicine

## 2016-08-08 MED ORDER — LISINOPRIL 10 MG PO TABS: ORAL_TABLET | ORAL | 0 refills | Status: DC

## 2016-08-08 MED ORDER — AMLODIPINE BESYLATE 10 MG PO TABS: ORAL_TABLET | ORAL | 0 refills | Status: DC

## 2016-08-08 MED ORDER — HYDROCHLOROTHIAZIDE 12.5 MG PO CAPS: ORAL_CAPSULE | ORAL | 0 refills | Status: DC

## 2016-08-08 NOTE — Telephone Encounter (Signed)
done

## 2016-08-22 ENCOUNTER — Encounter: Payer: Self-pay | Admitting: Family Medicine

## 2016-08-22 ENCOUNTER — Ambulatory Visit (INDEPENDENT_AMBULATORY_CARE_PROVIDER_SITE_OTHER): Payer: 59 | Admitting: Family Medicine

## 2016-08-22 VITALS — BP 135/79 | HR 71 | Temp 97.0°F | Ht 71.0 in | Wt 202.4 lb

## 2016-08-22 DIAGNOSIS — Z1211 Encounter for screening for malignant neoplasm of colon: Secondary | ICD-10-CM | POA: Diagnosis not present

## 2016-08-22 DIAGNOSIS — I10 Essential (primary) hypertension: Secondary | ICD-10-CM

## 2016-08-22 DIAGNOSIS — E8881 Metabolic syndrome: Secondary | ICD-10-CM | POA: Diagnosis not present

## 2016-08-22 MED ORDER — AMLODIPINE BESYLATE 10 MG PO TABS: ORAL_TABLET | ORAL | 3 refills | Status: DC

## 2016-08-22 MED ORDER — HYDROCHLOROTHIAZIDE 12.5 MG PO CAPS: ORAL_CAPSULE | ORAL | 3 refills | Status: DC

## 2016-08-22 MED ORDER — LISINOPRIL 10 MG PO TABS: ORAL_TABLET | ORAL | 3 refills | Status: DC

## 2016-08-22 NOTE — Progress Notes (Signed)
HPI  Patient presents today here to follow-up for hypertension and metabolic syndrome.  Hypertension Good medication compliance, blood pressure is usually 074 600 systolic at home. No headache, chest pain, dyspnea, palpitations.  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 No nocturia.  Watching his diet moderately. No formal exercise routine.  PMH: Smoking status noted ROS: Per HPI  Objective: BP 135/79   Pulse 71   Temp (!) 97 F (36.1 C) (Oral)   Ht 5' 11" (1.803 m)   Wt 202 lb 6.4 oz (91.8 kg)   BMI 28.23 kg/m  Gen: NAD, alert, cooperative with exam HEENT: NCAT CV: RRR, good S1/S2, no murmur Resp: CTABL, no wheezes, non-labored Ext: No edema, warm Neuro: Alert and oriented, No gross deficits  Assessment and plan:  # Hypertension Well-controlled on lisinopril plus HCTZ plus amlodipine, refill Labs Fasting for about 7 hours. Last ate at 9 AM  # Metabolic syndrome Weight up since last visit Patient concerned about his weight and watching diet.  # Screening for colon cancer Discussed colonoscopy at length, he declines, FOBT card given     Orders Placed This Encounter  Procedures  . Fecal occult blood, imunochemical    Standing Status:   Future    Standing Expiration Date:   08/22/2017  . CBC with Differential/Platelet  . CMP14+EGFR  . Lipid panel  . TSH    Meds ordered this encounter  Medications  . amLODipine (NORVASC) 10 MG tablet    Sig: TAKE 1 TABLET (10 MG TOTAL) BY MOUTH DAILY.    Dispense:  90 tablet    Refill:  3  . hydrochlorothiazide (MICROZIDE) 12.5 MG capsule    Sig: TAKE 1 CAPSULE (12.5 MG TOTAL) BY MOUTH DAILY.    Dispense:  90 capsule    Refill:  3  . lisinopril (PRINIVIL,ZESTRIL) 10 MG tablet    Sig: TAKE 1 TABLET (10 MG TOTAL) BY MOUTH DAILY.    Dispense:  90 tablet    Refill:  [place], MD [facility] 08/22/2016, 4:43 PM

## 2016-08-22 NOTE — Patient Instructions (Signed)
Great to see you!  Come back in 1 year unless you need us sooner.    

## 2016-08-23 ENCOUNTER — Other Ambulatory Visit: Payer: Self-pay | Admitting: Family Medicine

## 2016-08-23 DIAGNOSIS — R739 Hyperglycemia, unspecified: Secondary | ICD-10-CM

## 2016-08-23 DIAGNOSIS — R7989 Other specified abnormal findings of blood chemistry: Secondary | ICD-10-CM

## 2016-08-23 LAB — CMP14+EGFR
ALK PHOS: 79 IU/L (ref 39–117)
ALT: 34 IU/L (ref 0–44)
AST: 33 IU/L (ref 0–40)
Albumin/Globulin Ratio: 1.6 (ref 1.2–2.2)
Albumin: 4.2 g/dL (ref 3.6–4.8)
BILIRUBIN TOTAL: 0.5 mg/dL (ref 0.0–1.2)
BUN/Creatinine Ratio: 15 (ref 10–24)
BUN: 19 mg/dL (ref 8–27)
CHLORIDE: 105 mmol/L (ref 96–106)
CO2: 22 mmol/L (ref 20–29)
Calcium: 9.2 mg/dL (ref 8.6–10.2)
Creatinine, Ser: 1.29 mg/dL — ABNORMAL HIGH (ref 0.76–1.27)
GFR calc Af Amer: 68 mL/min/{1.73_m2} (ref >59)
GFR calc non Af Amer: 59 mL/min/{1.73_m2} — ABNORMAL LOW (ref >59)
GLUCOSE: 105 mg/dL — AB (ref 65–99)
Globulin, Total: 2.6 g/dL (ref 1.5–4.5)
POTASSIUM: 3.4 mmol/L — AB (ref 3.5–5.2)
Sodium: 144 mmol/L (ref 134–144)
Total Protein: 6.8 g/dL (ref 6.0–8.5)

## 2016-08-23 LAB — CBC WITH DIFFERENTIAL/PLATELET
BASOS ABS: 0.1 10*3/uL (ref 0.0–0.2)
Basos: 1 %
EOS (ABSOLUTE): 0.3 10*3/uL (ref 0.0–0.4)
Eos: 3 %
Hematocrit: 42.7 % (ref 37.5–51.0)
Hemoglobin: 14.5 g/dL (ref 13.0–17.7)
IMMATURE GRANS (ABS): 0 10*3/uL (ref 0.0–0.1)
Immature Granulocytes: 0 %
LYMPHS ABS: 1.9 10*3/uL (ref 0.7–3.1)
LYMPHS: 24 %
MCH: 30.2 pg (ref 26.6–33.0)
MCHC: 34 g/dL (ref 31.5–35.7)
MCV: 89 fL (ref 79–97)
MONOS ABS: 0.8 10*3/uL (ref 0.1–0.9)
Monocytes: 9 %
NEUTROS ABS: 5 10*3/uL (ref 1.4–7.0)
Neutrophils: 63 %
PLATELETS: 230 10*3/uL (ref 150–379)
RBC: 4.8 x10E6/uL (ref 4.14–5.80)
RDW: 13.9 % (ref 12.3–15.4)
WBC: 8 10*3/uL (ref 3.4–10.8)

## 2016-08-23 LAB — LIPID PANEL
CHOLESTEROL TOTAL: 115 mg/dL (ref 100–199)
Chol/HDL Ratio: 3.3 ratio (ref 0.0–5.0)
HDL: 35 mg/dL — AB (ref >39)
LDL Calculated: 57 mg/dL (ref 0–99)
Triglycerides: 115 mg/dL (ref 0–149)
VLDL CHOLESTEROL CAL: 23 mg/dL (ref 5–40)

## 2016-08-23 LAB — TSH: TSH: 7.63 u[IU]/mL — ABNORMAL HIGH (ref 0.450–4.500)

## 2016-11-04 ENCOUNTER — Other Ambulatory Visit: Payer: Self-pay | Admitting: Family Medicine

## 2017-11-13 ENCOUNTER — Other Ambulatory Visit: Payer: Self-pay | Admitting: *Deleted

## 2017-11-13 NOTE — Addendum Note (Signed)
Addended by: Julious Payer D on: 11/13/2017 11:01 AM   Modules accepted: Orders

## 2017-11-13 NOTE — Telephone Encounter (Signed)
NTBS Last OV 08/22/16

## 2017-11-21 ENCOUNTER — Telehealth: Payer: Self-pay | Admitting: *Deleted

## 2017-11-21 MED ORDER — AMLODIPINE BESYLATE 10 MG PO TABS: ORAL_TABLET | ORAL | 0 refills | Status: DC

## 2017-11-21 MED ORDER — LISINOPRIL 10 MG PO TABS: 10.0000 mg | ORAL_TABLET | Freq: Every day | ORAL | 0 refills | Status: DC

## 2017-11-21 MED ORDER — HYDROCHLOROTHIAZIDE 12.5 MG PO CAPS: ORAL_CAPSULE | ORAL | 0 refills | Status: DC

## 2017-11-21 NOTE — Telephone Encounter (Signed)
Pt aware refill sent into pharmacy 

## 2017-12-01 ENCOUNTER — Ambulatory Visit: Payer: 59 | Admitting: Family Medicine

## 2017-12-01 ENCOUNTER — Ambulatory Visit (INDEPENDENT_AMBULATORY_CARE_PROVIDER_SITE_OTHER): Payer: 59

## 2017-12-01 ENCOUNTER — Encounter: Payer: Self-pay | Admitting: Family Medicine

## 2017-12-01 VITALS — BP 154/81 | HR 77 | Temp 98.1°F | Ht 71.0 in | Wt 194.0 lb

## 2017-12-01 DIAGNOSIS — I10 Essential (primary) hypertension: Secondary | ICD-10-CM | POA: Diagnosis not present

## 2017-12-01 DIAGNOSIS — M25552 Pain in left hip: Secondary | ICD-10-CM

## 2017-12-01 DIAGNOSIS — E8881 Metabolic syndrome: Secondary | ICD-10-CM

## 2017-12-01 DIAGNOSIS — R351 Nocturia: Secondary | ICD-10-CM

## 2017-12-01 DIAGNOSIS — M160 Bilateral primary osteoarthritis of hip: Secondary | ICD-10-CM

## 2017-12-01 DIAGNOSIS — R590 Localized enlarged lymph nodes: Secondary | ICD-10-CM | POA: Diagnosis not present

## 2017-12-01 DIAGNOSIS — M25551 Pain in right hip: Secondary | ICD-10-CM | POA: Diagnosis not present

## 2017-12-01 DIAGNOSIS — R222 Localized swelling, mass and lump, trunk: Secondary | ICD-10-CM

## 2017-12-01 MED ORDER — AMLODIPINE BESYLATE 10 MG PO TABS: ORAL_TABLET | ORAL | 0 refills | Status: DC

## 2017-12-01 MED ORDER — LISINOPRIL 10 MG PO TABS: 10.0000 mg | ORAL_TABLET | Freq: Every day | ORAL | 0 refills | Status: DC

## 2017-12-01 MED ORDER — HYDROCHLOROTHIAZIDE 12.5 MG PO CAPS: ORAL_CAPSULE | ORAL | 0 refills | Status: DC

## 2017-12-01 NOTE — Patient Instructions (Signed)
Lymphadenopathy Lymphadenopathy refers to swollen or enlarged lymph glands, also called lymph nodes. Lymph glands are part of your body's defense (immune) system, which protects the body from infections, germs, and diseases. Lymph glands are found in many locations in your body, including the neck, underarm, and groin. Many things can cause lymph glands to become enlarged. When your immune system responds to germs, such as viruses or bacteria, infection-fighting cells and fluid build up. This causes the glands to grow in size. Usually, this is not something to worry about. The swelling and any soreness often go away without treatment. However, swollen lymph glands can also be caused by a number of diseases. Your health care provider may do various tests to help determine the cause. If the cause of your swollen lymph glands cannot be found, it is important to monitor your condition to make sure the swelling goes away. Follow these instructions at home: Watch your condition for any changes. The following actions may help to lessen any discomfort you are feeling:  Get plenty of rest.  Take medicines only as directed by your health care provider. Your health care provider may recommend over-the-counter medicines for pain.  Apply moist heat compresses to the site of swollen lymph nodes as directed by your health care provider. This can help reduce any pain.  Check your lymph nodes daily for any changes.  Keep all follow-up visits as directed by your health care provider. This is important.  Contact a health care provider if:  Your lymph nodes are still swollen after 2 weeks.  Your swelling increases or spreads to other areas.  Your lymph nodes are hard, seem fixed to the skin, or are growing rapidly.  Your skin over the lymph nodes is red and inflamed.  You have a fever.  You have chills.  You have fatigue.  You develop a sore throat.  You have abdominal pain.  You have weight  loss.  You have night sweats. Get help right away if:  You notice fluid leaking from the area of the enlarged lymph node.  You have severe pain in any area of your body.  You have chest pain.  You have shortness of breath. This information is not intended to replace advice given to you by your health care provider. Make sure you discuss any questions you have with your health care provider. Document Released: 10/31/2007 Document Revised: 06/29/2015 Document Reviewed: 08/26/2013 Elsevier Interactive Patient Education  2018 [employer]. Hypertension Hypertension is another name for high blood pressure. High blood pressure forces your heart to work harder to pump blood. This can cause problems over time. There are two numbers in a blood pressure reading. There is a top number (systolic) over a bottom number (diastolic). It is best to have a blood pressure below 120/80. Healthy choices can help lower your blood pressure. You may need medicine to help lower your blood pressure if:  Your blood pressure cannot be lowered with healthy choices.  Your blood pressure is higher than 130/80.  Follow these instructions at home: Eating and drinking  If directed, follow the DASH eating plan. This diet includes: ? Filling half of your plate at each meal with fruits and vegetables. ? Filling one quarter of your plate at each meal with whole grains. Whole grains include whole wheat pasta, brown rice, and whole grain bread. ? Eating or drinking low-fat dairy products, such as skim milk or low-fat yogurt. ? Filling one quarter of your plate at each meal with  low-fat (lean) proteins. Low-fat proteins include fish, skinless chicken, eggs, beans, and tofu. ? Avoiding fatty meat, cured and processed meat, or chicken with skin. ? Avoiding premade or processed food.  Eat less than 1,500 mg of salt (sodium) a day.  Limit alcohol use to no more than 1 drink a day for nonpregnant women and 2 drinks a day for  men. One drink equals 12 oz of beer, 5 oz of wine, or 1 oz of hard liquor. Lifestyle  Work with your doctor to stay at a healthy weight or to lose weight. Ask your doctor what the best weight is for you.  Get at least 30 minutes of exercise that causes your heart to beat faster (aerobic exercise) most days of the week. This may include walking, swimming, or biking.  Get at least 30 minutes of exercise that strengthens your muscles (resistance exercise) at least 3 days a week. This may include lifting weights or pilates.  Do not use any products that contain nicotine or tobacco. This includes cigarettes and e-cigarettes. If you need help quitting, ask your doctor.  Check your blood pressure at home as told by your doctor.  Keep all follow-up visits as told by your doctor. This is important. Medicines  Take over-the-counter and prescription medicines only as told by your doctor. Follow directions carefully.  Do not skip doses of blood pressure medicine. The medicine does not work as well if you skip doses. Skipping doses also puts you at risk for problems.  Ask your doctor about side effects or reactions to medicines that you should watch for. Contact a doctor if:  You think you are having a reaction to the medicine you are taking.  You have headaches that keep coming back (recurring).  You feel dizzy.  You have swelling in your ankles.  You have trouble with your vision. Get help right away if:  You get a very bad headache.  You start to feel confused.  You feel weak or numb.  You feel faint.  You get very bad pain in your: ? Chest. ? Belly (abdomen).  You throw up (vomit) more than once.  You have trouble breathing. Summary  Hypertension is another name for high blood pressure.  Making healthy choices can help lower blood pressure. If your blood pressure cannot be controlled with healthy choices, you may need to take medicine. This information is not intended to  replace advice given to you by your health care provider. Make sure you discuss any questions you have with your health care provider. Document Released: 07/10/2007 Document Revised: 12/20/2015 Document Reviewed: 12/20/2015 Elsevier Interactive Patient Education  [employer].

## 2017-12-01 NOTE — Progress Notes (Signed)
Subjective:    Patient ID: [name], male    DOB: August 23, 1950, 67 y.o.   MRN: [id number]  Chief Complaint:  Medication Refill; Swollen lymph nodes right groin; and Right leg pain   HPI: [name] is a 67 y.o. male presenting on 12/01/2017 for Medication Refill; Swollen lymph nodes right groin; and Right leg pain  Pt presents today for a refill on his hypertensive medications. He reports compliance with medication regimen and tolerance to medication regimen. Denies blurred vision, chest pain, shortness of breath, leg swelling, numbness, tingling, weakness, dizziness, or confusion. He does report intermittent mild headaches. States the headaches are dull and subside without intervention.  Pt reports increasing bilateral hip pain with the right being worse than the left. States this has been ongoing for several months and is getting worse. States the pain is worse with certain movements and when he has been more active. He states the pain can be dull to sharp/shooting pain. States 8/10 at worst. States he has tried heat and horse lineament without relief of the pain. Denies injury.  Pt reports right inguinal lymph node swelling and a "knot" in his left lower back. He report he noticed the lymph node swelling several months ago. States at times they will be larger than grapes. States they vary in size and the swelling comes and goes. He denies fever, chills, unexplained weight loss, night sweats, or hemoptysis. He denies recent travel, exposure to cat scratches or bites, tick bites, raw meat, blood transfusions, or recent illnesses. He states the "knot" on his back started several months ago and has gotten larger. States the area is tender at times and pain will shoot from his lower back to his left lower abdomen. He denies n/v/d, constipation, blood in stool, scrotal pain, and scrotal swelling.  Pt reports nocturia that has been ongoing for several years. Denies hematuria, dysuria, or frequency.  States he does have have trouble voiding at times.   Relevant past medical, surgical, family, and social history reviewed and updated as indicated.  Allergies and medications reviewed and updated.   Past Medical History:  Diagnosis Date  . Hypertension     History reviewed. No pertinent surgical history.  Social History   Socioeconomic History  . Marital status: Married    Spouse name: Not on file  . Number of children: Not on file  . Years of education: Not on file  . Highest education level: Not on file  Occupational History  . Not on file  Social Needs  . Financial resource strain: Not on file  . Food insecurity:    Worry: Not on file    Inability: Not on file  . Transportation needs:    Medical: Not on file    Non-medical: Not on file  Tobacco Use  . Smoking status: Never Smoker  . Smokeless tobacco: Never Used  Substance and Sexual Activity  . Alcohol use: No  . Drug use: No  . Sexual activity: Not on file  Lifestyle  . Physical activity:    Days per week: Not on file    Minutes per session: Not on file  . Stress: Not on file  Relationships  . Social connections:    Talks on phone: Not on file    Gets together: Not on file    Attends religious service: Not on file    Active member of club or organization: Not on file    Attends meetings of clubs or organizations: Not  on file    Relationship status: Not on file  . Intimate partner violence:    Fear of current or ex partner: Not on file    Emotionally abused: Not on file    Physically abused: Not on file    Forced sexual activity: Not on file  Other Topics Concern  . Not on file  Social History Narrative  . Not on file    Outpatient Encounter Medications as of 12/01/2017  Medication Sig  . amLODipine (NORVASC) 10 MG tablet TAKE 1 TABLET (10 MG TOTAL) BY MOUTH DAILY.  . hydrochlorothiazide (MICROZIDE) 12.5 MG capsule TAKE 1 CAPSULE (12.5 MG TOTAL) BY MOUTH DAILY.  Marland Kitchen lisinopril (PRINIVIL,ZESTRIL) 10  MG tablet Take 1 tablet (10 mg total) by mouth daily.  . [DISCONTINUED] amLODipine (NORVASC) 10 MG tablet TAKE 1 TABLET (10 MG TOTAL) BY MOUTH DAILY.  . [DISCONTINUED] hydrochlorothiazide (MICROZIDE) 12.5 MG capsule TAKE 1 CAPSULE (12.5 MG TOTAL) BY MOUTH DAILY.  . [DISCONTINUED] lisinopril (PRINIVIL,ZESTRIL) 10 MG tablet Take 1 tablet (10 mg total) by mouth daily.  . [DISCONTINUED] fluticasone (FLONASE) 50 MCG/ACT nasal spray Place 2 sprays into both nostrils daily.   No facility-administered encounter medications on file as of 12/01/2017.     No Known Allergies  Review of Systems  Constitutional: Negative for activity change, appetite change, chills, diaphoresis, fatigue, fever and unexpected weight change.  Respiratory: Negative for cough, chest tightness and shortness of breath.   Cardiovascular: Negative for chest pain, palpitations and leg swelling.  Gastrointestinal: Positive for abdominal pain. Negative for abdominal distention, anal bleeding, blood in stool, constipation, diarrhea, nausea, rectal pain and vomiting.  Endocrine: Negative for cold intolerance, heat intolerance, polydipsia, polyphagia and polyuria.  Genitourinary: Positive for difficulty urinating. Negative for decreased urine volume, discharge, dysuria, flank pain, frequency, hematuria, penile pain, penile swelling, scrotal swelling, testicular pain and urgency.       Nocturia  Musculoskeletal: Positive for arthralgias, gait problem and myalgias.  Skin: Negative for color change, pallor, rash and wound.  Neurological: Positive for headaches. Negative for dizziness, tremors, seizures, syncope, facial asymmetry, speech difficulty, weakness, light-headedness and numbness.  Hematological: Positive for adenopathy.  Psychiatric/Behavioral: Negative for behavioral problems and confusion.  All other systems reviewed and are negative.       Objective:    BP (!) 154/81   Pulse 77   Temp 98.1 F (36.7 C) (Oral)   Ht 5'  11" (1.803 m)   Wt 194 lb (88 kg)   BMI 27.06 kg/m    Wt Readings from Last 3 Encounters:  12/01/17 194 lb (88 kg)  08/22/16 202 lb 6.4 oz (91.8 kg)  02/10/15 181 lb 6.4 oz (82.3 kg)    Physical Exam  Constitutional: He is oriented to person, place, and time. He appears well-developed and well-nourished. He is cooperative. He appears distressed (mild).  HENT:  Head: Normocephalic and atraumatic.  Right Ear: External ear normal.  Left Ear: External ear normal.  Nose: Nose normal.  Mouth/Throat: Oropharynx is clear and moist. No oropharyngeal exudate.  Eyes: Pupils are equal, round, and reactive to light. Conjunctivae, EOM and lids are normal.  Neck: Trachea normal, normal range of motion, full passive range of motion without pain and phonation normal. Neck supple. No JVD present. Carotid bruit is not present. No thyroid mass and no thyromegaly present.  Cardiovascular: Normal rate, regular rhythm, normal heart sounds and intact distal pulses. Exam reveals no gallop and no friction rub.  No murmur heard. Pulmonary/Chest: Effort normal  and breath sounds normal. No respiratory distress.  Abdominal: Soft. Normal aorta and bowel sounds are normal. He exhibits no distension and no mass. There is no hepatosplenomegaly. There is no tenderness. There is no rigidity, no rebound, no guarding and no CVA tenderness. No hernia.  Musculoskeletal:       Right hip: He exhibits decreased range of motion, decreased strength and tenderness. He exhibits no bony tenderness, no swelling, no crepitus, no deformity and no laceration.       Left hip: He exhibits decreased range of motion and tenderness. He exhibits normal strength, no bony tenderness, no swelling, no crepitus, no deformity and no laceration.       Back:  Lymphadenopathy:       Right: Inguinal (three nodes greater than 1.5 cm) adenopathy present. No supraclavicular adenopathy present.       Left: No inguinal and no supraclavicular adenopathy  present.  Neurological: He is alert and oriented to person, place, and time. He displays normal reflexes. No cranial nerve deficit or sensory deficit.  Skin: Skin is warm and dry. Capillary refill takes less than 2 seconds.  Psychiatric: His speech is normal and behavior is normal. Judgment and thought content normal. His mood appears anxious (mildly). Cognition and memory are normal.  Nursing note and vitals reviewed.   Results for orders placed or performed in visit on 08/22/16  CBC with Differential/Platelet  Result Value Ref Range   WBC 8.0 3.4 - 10.8 x10E3/uL   RBC 4.80 4.14 - 5.80 x10E6/uL   Hemoglobin 14.5 13.0 - 17.7 g/dL   Hematocrit 42.7 37.5 - 51.0 %   MCV 89 79 - 97 fL   MCH 30.2 26.6 - 33.0 pg   MCHC 34.0 31.5 - 35.7 g/dL   RDW 13.9 12.3 - 15.4 %   Platelets 230 150 - 379 x10E3/uL   Neutrophils 63 Not Estab. %   Lymphs 24 Not Estab. %   Monocytes 9 Not Estab. %   Eos 3 Not Estab. %   Basos 1 Not Estab. %   Neutrophils Absolute 5.0 1.4 - 7.0 x10E3/uL   Lymphocytes Absolute 1.9 0.7 - 3.1 x10E3/uL   Monocytes Absolute 0.8 0.1 - 0.9 x10E3/uL   EOS (ABSOLUTE) 0.3 0.0 - 0.4 x10E3/uL   Basophils Absolute 0.1 0.0 - 0.2 x10E3/uL   Immature Granulocytes 0 Not Estab. %   Immature Grans (Abs) 0.0 0.0 - 0.1 x10E3/uL  CMP14+EGFR  Result Value Ref Range   Glucose 105 (H) 65 - 99 mg/dL   BUN 19 8 - 27 mg/dL   Creatinine, Ser 1.29 (H) 0.76 - 1.27 mg/dL   GFR calc non Af Amer 59 (L) >59 mL/min/1.73   GFR calc Af Amer 68 >59 mL/min/1.73   BUN/Creatinine Ratio 15 10 - 24   Sodium 144 134 - 144 mmol/L   Potassium 3.4 (L) 3.5 - 5.2 mmol/L   Chloride 105 96 - 106 mmol/L   CO2 22 20 - 29 mmol/L   Calcium 9.2 8.6 - 10.2 mg/dL   Total Protein 6.8 6.0 - 8.5 g/dL   Albumin 4.2 3.6 - 4.8 g/dL   Globulin, Total 2.6 1.5 - 4.5 g/dL   Albumin/Globulin Ratio 1.6 1.2 - 2.2   Bilirubin Total 0.5 0.0 - 1.2 mg/dL   Alkaline Phosphatase 79 39 - 117 IU/L   AST 33 0 - 40 IU/L   ALT 34 0 - 44  IU/L  Lipid panel  Result Value Ref Range   Cholesterol, Total 115  100 - 199 mg/dL   Triglycerides 115 0 - 149 mg/dL   HDL 35 (L) >39 mg/dL   VLDL Cholesterol Cal 23 5 - 40 mg/dL   LDL Calculated 57 0 - 99 mg/dL   Chol/HDL Ratio 3.3 0.0 - 5.0 ratio  TSH  Result Value Ref Range   TSH 7.630 (H) 0.450 - 4.500 uIU/mL     X:Ray: Bilateral hip with pelvis: Xray revealed advanced right hip degenerative changes without acute fracture or AVN and mild degenerative changes in left hip per radiology reading.   Pertinent labs & imaging results that were available during my care of the patient were reviewed by me and considered in my medical decision making.  Assessment & Plan:  Rex was seen today for medication refill, swollen lymph nodes right groin and right leg pain.  Diagnoses and all orders for this visit:  Essential hypertension DASH diet. Exercise encouraged. Medications as prescribed. Labs pending.  -     CBC with Differential/Platelet -     CMP14+EGFR -     Lipid panel -     TSH -     amLODipine (NORVASC) 10 MG tablet; TAKE 1 TABLET (10 MG TOTAL) BY MOUTH DAILY. -     lisinopril (PRINIVIL,ZESTRIL) 10 MG tablet; Take 1 tablet (10 mg total) by mouth daily. -     hydrochlorothiazide (MICROZIDE) 12.5 MG capsule; TAKE 1 CAPSULE (12.5 MG TOTAL) BY MOUTH DAILY.  Hip pain, bilateral Xray revealed advanced right hip degenerative changes without acute fracture or AVN and mild degenerative changes in left hip.  -     DG HIPS BILAT WITH PELVIS 3-4 VIEWS; Future  Metabolic syndrome Weight management, diet and exercise. -     Lipid panel -     TSH  Inguinal lymphadenopathy Three prominent right inguinal nodes greater than 1.5 cm. Ongoing for several months. No recent travel, medications that would contribute to lymphadenopathy, recent cat scratches or bites, or recent tick bites. No high risk behaviors or recent blood transfusions. No recent illnesses.  -     CT Abdomen Pelvis W Contrast;  Future  Palpable mass of lower back Solid internal tender mass to left lower back, approximately 3 cm in diameter. No recent injury. Pain from mass radiates to left abdomen.  -     CT Abdomen Pelvis W Contrast; Future  Nocturia Denies fever, chill, or dysuria.  -     PSA, total and free  Primary osteoarthritis of both hips Xray revealed advanced right hip degenerative changes without acute fracture or AVN and mild degenerative changes in left hip.  Will try conservative measures. Weight management. Aerobic and strengthening exercises. Topical capsaicin cream or Biofreeze. If conservative measures are ineffective, will refer to ortho.      Continue all other maintenance medications.  Follow up plan: Return in about 4 weeks (around 12/29/2017), or if symptoms worsen or fail to improve.  Educational handout given for hypertension and lymphadenopathy  The above assessment and management plan was discussed with the patient. The patient verbalized understanding of and has agreed to the management plan. Patient is aware to call the clinic if symptoms persist or worsen. Patient is aware when to return to the clinic for a follow-up visit. Patient educated on when it is appropriate to go to the emergency department.   Monia Pouch, FNP-C [facility] [phone]

## 2017-12-02 LAB — LIPID PANEL
CHOLESTEROL TOTAL: 132 mg/dL (ref 100–199)
Chol/HDL Ratio: 3.3 ratio (ref 0.0–5.0)
HDL: 40 mg/dL (ref >39)
LDL CALC: 77 mg/dL (ref 0–99)
Triglycerides: 75 mg/dL (ref 0–149)
VLDL CHOLESTEROL CAL: 15 mg/dL (ref 5–40)

## 2017-12-02 LAB — CBC WITH DIFFERENTIAL/PLATELET
Basophils Absolute: 0.1 10*3/uL (ref 0.0–0.2)
Basos: 1 %
EOS (ABSOLUTE): 0 10*3/uL (ref 0.0–0.4)
EOS: 0 %
HEMOGLOBIN: 15.9 g/dL (ref 13.0–17.7)
Hematocrit: 46.2 % (ref 37.5–51.0)
IMMATURE GRANS (ABS): 0 10*3/uL (ref 0.0–0.1)
Immature Granulocytes: 0 %
LYMPHS ABS: 1.4 10*3/uL (ref 0.7–3.1)
Lymphs: 13 %
MCH: 29.7 pg (ref 26.6–33.0)
MCHC: 34.4 g/dL (ref 31.5–35.7)
MCV: 86 fL (ref 79–97)
MONOS ABS: 0.7 10*3/uL (ref 0.1–0.9)
Monocytes: 7 %
NEUTROS PCT: 79 %
Neutrophils Absolute: 8 10*3/uL — ABNORMAL HIGH (ref 1.4–7.0)
PLATELETS: 271 10*3/uL (ref 150–450)
RBC: 5.35 x10E6/uL (ref 4.14–5.80)
RDW: 13 % (ref 12.3–15.4)
WBC: 10.2 10*3/uL (ref 3.4–10.8)

## 2017-12-02 LAB — CMP14+EGFR
ALBUMIN: 4.7 g/dL (ref 3.6–4.8)
ALK PHOS: 79 IU/L (ref 39–117)
ALT: 30 IU/L (ref 0–44)
AST: 37 IU/L (ref 0–40)
Albumin/Globulin Ratio: 1.7 (ref 1.2–2.2)
BUN / CREAT RATIO: 16 (ref 10–24)
BUN: 20 mg/dL (ref 8–27)
Bilirubin Total: 0.8 mg/dL (ref 0.0–1.2)
CHLORIDE: 103 mmol/L (ref 96–106)
CO2: 18 mmol/L — AB (ref 20–29)
CREATININE: 1.25 mg/dL (ref 0.76–1.27)
Calcium: 10.5 mg/dL — ABNORMAL HIGH (ref 8.6–10.2)
GFR calc non Af Amer: 60 mL/min/{1.73_m2} (ref >59)
GFR, EST AFRICAN AMERICAN: 69 mL/min/{1.73_m2} (ref >59)
Globulin, Total: 2.7 g/dL (ref 1.5–4.5)
Glucose: 119 mg/dL — ABNORMAL HIGH (ref 65–99)
Potassium: 3.8 mmol/L (ref 3.5–5.2)
Sodium: 142 mmol/L (ref 134–144)
TOTAL PROTEIN: 7.4 g/dL (ref 6.0–8.5)

## 2017-12-02 LAB — PSA, TOTAL AND FREE
PROSTATE SPECIFIC AG, SERUM: 1.2 ng/mL (ref 0.0–4.0)
PSA, Free Pct: 24.2 %
PSA, Free: 0.29 ng/mL

## 2017-12-02 LAB — TSH: TSH: 3.96 u[IU]/mL (ref 0.450–4.500)

## 2017-12-11 ENCOUNTER — Ambulatory Visit (HOSPITAL_COMMUNITY)
Admission: RE | Admit: 2017-12-11 | Discharge: 2017-12-11 | Disposition: A | Discharge status: Home / Self Care | Payer: 59 | Source: Ambulatory Visit | Attending: Family Medicine | Admitting: Family Medicine

## 2017-12-11 ENCOUNTER — Telehealth: Payer: Self-pay | Admitting: Family Medicine

## 2017-12-11 DIAGNOSIS — K5730 Diverticulosis of large intestine without perforation or abscess without bleeding: Secondary | ICD-10-CM | POA: Diagnosis not present

## 2017-12-11 DIAGNOSIS — R222 Localized swelling, mass and lump, trunk: Secondary | ICD-10-CM

## 2017-12-11 DIAGNOSIS — I10 Essential (primary) hypertension: Secondary | ICD-10-CM

## 2017-12-11 DIAGNOSIS — R590 Localized enlarged lymph nodes: Secondary | ICD-10-CM | POA: Insufficient documentation

## 2017-12-11 DIAGNOSIS — I700 Atherosclerosis of aorta: Secondary | ICD-10-CM | POA: Insufficient documentation

## 2017-12-11 MED ORDER — IOPAMIDOL (ISOVUE-300) INJECTION 61%
100.0000 mL | Freq: Once | INTRAVENOUS | Status: AC | PRN
  Administered 2017-12-11: 100 mL via INTRAVENOUS

## 2017-12-11 MED ORDER — LISINOPRIL 10 MG PO TABS: 10.0000 mg | ORAL_TABLET | Freq: Every day | ORAL | 1 refills | Status: DC

## 2017-12-11 MED ORDER — AMLODIPINE BESYLATE 10 MG PO TABS: ORAL_TABLET | ORAL | 1 refills | Status: DC

## 2017-12-11 MED ORDER — HYDROCHLOROTHIAZIDE 12.5 MG PO CAPS: ORAL_CAPSULE | ORAL | 1 refills | Status: DC

## 2017-12-11 NOTE — Telephone Encounter (Signed)
Aware #90 sent in - and that he needs appt for 4 weeks .

## 2017-12-12 ENCOUNTER — Other Ambulatory Visit: Payer: Self-pay | Admitting: Family Medicine

## 2017-12-12 DIAGNOSIS — M160 Bilateral primary osteoarthritis of hip: Secondary | ICD-10-CM

## 2017-12-12 DIAGNOSIS — I700 Atherosclerosis of aorta: Secondary | ICD-10-CM | POA: Insufficient documentation

## 2017-12-12 MED ORDER — ASPIRIN EC 81 MG PO TBEC: 81.0000 mg | DELAYED_RELEASE_TABLET | Freq: Every day | ORAL | 12 refills | Status: DC

## 2017-12-24 ENCOUNTER — Encounter (INDEPENDENT_AMBULATORY_CARE_PROVIDER_SITE_OTHER): Payer: Self-pay | Admitting: Orthopaedic Surgery

## 2017-12-24 ENCOUNTER — Ambulatory Visit (INDEPENDENT_AMBULATORY_CARE_PROVIDER_SITE_OTHER): Payer: 59 | Admitting: Orthopaedic Surgery

## 2017-12-24 VITALS — BP 121/69 | HR 65 | Ht 71.0 in | Wt 194.0 lb

## 2017-12-24 DIAGNOSIS — M25551 Pain in right hip: Secondary | ICD-10-CM

## 2017-12-24 NOTE — Progress Notes (Signed)
Office Visit Note   Patient: [name]           Date of Birth: Jul 28, 1950           MRN: [id number] Visit Date: 12/24/2017              Requested by: Sonny Masters, FNP [address] [place], [place] [address] PCP: Sonny Masters, FNP   Assessment & Plan: Visit Diagnoses:  1. Pain of right hip joint     Plan: Near end-stage osteoarthritis right hip.  I think this accounts for fair amount of his discomfort particularly in the groin and the right thigh.  He also has had some discomfort into his calf and has evidence of degenerative changes at L4-5 and L5-S1 by CT scan.  I also thought that he had a lytic area in the proximal femoral and ostium.  I discussed this with 1 of the radiologist and we both felt it might be worthwhile to obtain an MRI scan.  We will also schedule an intra-articular cortisone injection of his right hip.  Office visit approximately 45 minutes 50% of the time in counseling regarding all of the above  Follow-Up Instructions: Return after MRI right hip.   Orders:  No orders of the defined types were placed in this encounter.  No orders of the defined types were placed in this encounter.     Procedures: No procedures performed   Clinical Data: No additional findings.   Subjective: Chief Complaint  Patient presents with  . New Patient (Initial Visit)    R HIP PAIN 2-3 YRS RADIATES DOWN R LEG TO FOOT  THIGH AND CALF MUSCLE STAYS SORE NO PRIOR INJURY, INJECTIONS OR SURGERY  [name] is 67 years old and visited the office for evaluation of right hip pain.  He recently was seen by his primary care physician for evaluation of a mass in his back.  CT scan was obtained revealing some degenerative changes at L4-5 and L5-S1 and also considerable degenerative change of his right hip.  The mass was felt to be "fatty" according to Mr. Downie.  He also had "lots of blood work" all of which was thought to be normal.  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  He notes he has a little bit of a limp referable to his right buttock and right hip.  He will experience some pain along the anterior aspect of his right thigh but also on occasion will have pain in his right calf.  He is referred because of the arthritis in his hip and how that may be impacting his life.  I did review the films on the PACS system.  He does have considerable degenerative change of his right hip.  I also thought I saw a lytic lesion in the proximal femoral canal I have discussed this with 1 of the radiologist who agreed and thought that MRI scan was a reasonable approach.  HPI  Review of Systems  Constitutional: Negative for fever.  HENT: Negative for ear pain.   Eyes: Negative for pain.  Respiratory: Negative for cough and shortness of breath.   Cardiovascular: Negative for leg swelling.  Gastrointestinal: Negative for constipation and diarrhea.  Genitourinary: Negative for difficulty urinating.  Musculoskeletal: Negative for back pain and neck pain.  Skin: Negative for rash.  Allergic/Immunologic: Negative for food allergies.  Neurological: Positive for weakness and numbness.  Hematological: Bruises/bleeds easily.  Psychiatric/Behavioral: Positive for sleep  disturbance.     Objective: Vital Signs: BP 121/69 (BP Location: Left Arm, Patient Position: Sitting, Cuff Size: Normal)   Pulse 65   Ht 5\' 11"  (1.803 m)   Wt 194 lb (88 kg)   BMI 27.06 kg/m   Physical Exam  Constitutional: He is oriented to person, place, and time. He appears well-developed and well-nourished.  HENT:  Mouth/Throat: Oropharynx is clear and moist.  Eyes: Pupils are equal, round, and reactive to light. EOM are normal.  Pulmonary/Chest: Effort normal.  Neurological: He is alert and oriented to person, place, and time.  Skin: Skin is warm and dry.  Psychiatric: He has a normal mood and affect. His behavior is normal.    Ortho Exam awake alert and oriented x3.  Comfortable sitting.   Definitely has some decreased range of motion of his right hip but surprisingly little if any pain.  He had about 30 degrees of external rotation only about 10 degrees of internal rotation of his right hip. painless range of motion of his left hip.  Leg raise is negative.  Excellent pulses.  No percussible tenderness of his lumbar spine.  No localized areas of tenderness about his right hip  Specialty Comments:  No specialty comments available.  Imaging: No results found.   PMFS History: Patient Active Problem List   Diagnosis Date Noted  . Aortic atherosclerosis (HCC) 12/12/2017  . Primary osteoarthritis of both hips 12/01/2017  . Nocturia 12/01/2017  . Palpable mass of lower back 12/01/2017  . Inguinal lymphadenopathy 12/01/2017  . HTN (hypertension) 07/11/2012  . Metabolic syndrome 07/11/2012   Past Medical History:  Diagnosis Date  . Hypertension     History reviewed. No pertinent family history.  History reviewed. No pertinent surgical history. Social History   Occupational History  . Not on file  Tobacco Use  . Smoking status: Never Smoker  . Smokeless tobacco: Never Used  Substance and Sexual Activity  . Alcohol use: No  . Drug use: No  . Sexual activity: Not on file

## 2017-12-25 ENCOUNTER — Other Ambulatory Visit (INDEPENDENT_AMBULATORY_CARE_PROVIDER_SITE_OTHER): Payer: Self-pay | Admitting: Orthopaedic Surgery

## 2017-12-25 DIAGNOSIS — M1611 Unilateral primary osteoarthritis, right hip: Secondary | ICD-10-CM

## 2017-12-25 DIAGNOSIS — Z1389 Encounter for screening for other disorder: Secondary | ICD-10-CM

## 2017-12-25 DIAGNOSIS — M899 Disorder of bone, unspecified: Secondary | ICD-10-CM

## 2017-12-31 ENCOUNTER — Ambulatory Visit (HOSPITAL_COMMUNITY)
Admission: RE | Admit: 2017-12-31 | Discharge: 2017-12-31 | Disposition: A | Discharge status: Home / Self Care | Payer: 59 | Source: Ambulatory Visit | Attending: Orthopaedic Surgery | Admitting: Orthopaedic Surgery

## 2017-12-31 ENCOUNTER — Ambulatory Visit: Payer: 59

## 2017-12-31 DIAGNOSIS — M899 Disorder of bone, unspecified: Secondary | ICD-10-CM

## 2017-12-31 DIAGNOSIS — Z1389 Encounter for screening for other disorder: Secondary | ICD-10-CM

## 2017-12-31 DIAGNOSIS — M1611 Unilateral primary osteoarthritis, right hip: Secondary | ICD-10-CM | POA: Diagnosis not present

## 2018-01-02 ENCOUNTER — Encounter (HOSPITAL_COMMUNITY): Payer: Self-pay

## 2018-01-02 ENCOUNTER — Ambulatory Visit (HOSPITAL_COMMUNITY)
Admission: RE | Admit: 2018-01-02 | Discharge: 2018-01-02 | Disposition: A | Discharge status: Home / Self Care | Payer: 59 | Source: Ambulatory Visit | Attending: Orthopaedic Surgery | Admitting: Orthopaedic Surgery

## 2018-01-02 DIAGNOSIS — M1611 Unilateral primary osteoarthritis, right hip: Secondary | ICD-10-CM | POA: Insufficient documentation

## 2018-01-02 MED ORDER — IOPAMIDOL (ISOVUE-300) INJECTION 61%
INTRAVENOUS | Status: AC
  Administered 2018-01-02: 20 mL
  Filled 2018-01-02: qty 50

## 2018-01-02 MED ORDER — METHYLPREDNISOLONE ACETATE 40 MG/ML IJ SUSP: 80.0000 mg | Freq: Once | INTRAMUSCULAR | Status: DC

## 2018-01-02 MED ORDER — IOPAMIDOL (ISOVUE-300) INJECTION 61%: 50.0000 mL | Freq: Once | INTRAVENOUS | Status: DC | PRN

## 2018-01-02 MED ORDER — LIDOCAINE HCL (PF) 2 % IJ SOLN
INTRAMUSCULAR | Status: AC
  Filled 2018-01-02: qty 10

## 2018-01-02 MED ORDER — METHYLPREDNISOLONE ACETATE 40 MG/ML IJ SUSP
INTRAMUSCULAR | Status: AC
  Filled 2018-01-02: qty 1

## 2018-01-07 ENCOUNTER — Ambulatory Visit (INDEPENDENT_AMBULATORY_CARE_PROVIDER_SITE_OTHER): Payer: 59 | Admitting: Orthopaedic Surgery

## 2018-01-07 ENCOUNTER — Encounter (INDEPENDENT_AMBULATORY_CARE_PROVIDER_SITE_OTHER): Payer: Self-pay | Admitting: Orthopaedic Surgery

## 2018-01-07 VITALS — BP 114/70 | HR 80 | Ht 71.0 in | Wt 194.0 lb

## 2018-01-07 DIAGNOSIS — M25551 Pain in right hip: Secondary | ICD-10-CM | POA: Diagnosis not present

## 2018-01-07 NOTE — Progress Notes (Signed)
   Office Visit Note   Patient: [name]           Date of Birth: 08/26/1950           MRN: [id number] Visit Date: 01/07/2018              Requested by: Sonny Mastersakes, Linda M, FNP [address] [place], [place] [id number] PCP: Sonny Mastersakes, Linda M, FNP   Assessment & Plan: Visit Diagnoses:  1. Pain of right hip joint     Plan: Much better after the intra-articular cortisone injection right hip this past Friday.  Able to work without a problem.  Follow-up MRI scan of his right thigh and hip based on the possible lytic lesion in the proximal femur was negative other than the advanced arthritis of his right hip no acute osseous findings.  No muscular tenderness or other soft tissue abnormalities identified within the right thigh.  On discussion regarding the arthritis to what he may expect over time.  We could always repeat cortisone injection in the right hip. he is aware of the definitive procedure of a hip replacement  Follow-Up Instructions: Return if symptoms worsen or fail to improve.   Orders:  No orders of the defined types were placed in this encounter.  No orders of the defined types were placed in this encounter.     Procedures: No procedures performed   Clinical Data: No additional findings.   Subjective: Chief Complaint  Patient presents with  . Right Hip - Pain, Follow-up    MRI Right Femur Review   Patient returns to review MRI Right Femur. He did have a right hip injection under fluoroscopy on 01/02/18 which has helped his pain.  HPI  Review of Systems   Objective: Vital Signs: BP 114/70   Pulse 80   Ht 5\' 11"  (1.803 m)   Wt 194 lb (88 kg)   BMI 27.06 kg/m   Physical Exam  Constitutional: He is oriented to person, place, and time. He appears well-developed and well-nourished.  HENT:  Mouth/Throat: Oropharynx is clear and moist.  Eyes: Pupils are equal, round, and reactive to light. EOM are normal.  Pulmonary/Chest: Effort normal.  Neurological: He is  alert and oriented to person, place, and time.  Skin: Skin is warm and dry.  Psychiatric: He has a normal mood and affect. His behavior is normal.    Ortho Exam walks with a distinctively less right hip limp after the cortisone injection.  No distal edema.  Neurologically intact.  Straight leg raise negative Specialty Comments:  No specialty comments available.  Imaging: No results found.   PMFS History: Patient Active Problem List   Diagnosis Date Noted  . Aortic atherosclerosis (HCC) 12/12/2017  . Primary osteoarthritis of both hips 12/01/2017  . Nocturia 12/01/2017  . Palpable mass of lower back 12/01/2017  . Inguinal lymphadenopathy 12/01/2017  . HTN (hypertension) 07/11/2012  . Metabolic syndrome 07/11/2012   Past Medical History:  Diagnosis Date  . Hypertension     History reviewed. No pertinent family history.  History reviewed. No pertinent surgical history. Social History   Occupational History  . Not on file  Tobacco Use  . Smoking status: Never Smoker  . Smokeless tobacco: Never Used  Substance and Sexual Activity  . Alcohol use: No  . Drug use: No  . Sexual activity: Not on file

## 2018-01-22 ENCOUNTER — Telehealth (INDEPENDENT_AMBULATORY_CARE_PROVIDER_SITE_OTHER): Payer: Self-pay | Admitting: Orthopaedic Surgery

## 2018-01-22 NOTE — Telephone Encounter (Signed)
Advised patient's wife Kendal [place]Bonnie ([place] that Dr. Cleophas DunkerWhitfield will be back in the office tomorrow and this will be discussed with him then. She verbalized understanding.

## 2018-01-22 NOTE — Telephone Encounter (Signed)
Patient's wife [name] called stating [name] had an injection in November and the pain has returned.  Patient is requesting a prescription for pain medicine to be sent to [facility] on [address] [place]

## 2018-01-23 ENCOUNTER — Other Ambulatory Visit (INDEPENDENT_AMBULATORY_CARE_PROVIDER_SITE_OTHER): Payer: Self-pay | Admitting: Orthopedic Surgery

## 2018-01-23 MED ORDER — TRAMADOL HCL 50 MG PO TABS: 50.0000 mg | ORAL_TABLET | Freq: Two times a day (BID) | ORAL | 0 refills | Status: DC | PRN

## 2018-01-23 NOTE — Telephone Encounter (Signed)
done

## 2018-01-23 NOTE — Telephone Encounter (Signed)
LMOM

## 2018-01-23 NOTE — Telephone Encounter (Signed)
Tramadol 50 mg #30 1-2 tabs po bid prn

## 2018-01-23 NOTE — Telephone Encounter (Signed)
Please advise 

## 2018-03-30 ENCOUNTER — Other Ambulatory Visit: Payer: Self-pay | Admitting: Family Medicine

## 2018-03-30 DIAGNOSIS — I10 Essential (primary) hypertension: Secondary | ICD-10-CM

## 2018-06-16 ENCOUNTER — Other Ambulatory Visit: Payer: Self-pay | Admitting: *Deleted

## 2018-06-22 ENCOUNTER — Other Ambulatory Visit: Payer: Self-pay | Admitting: Family Medicine

## 2018-06-22 DIAGNOSIS — I700 Atherosclerosis of aorta: Secondary | ICD-10-CM

## 2018-08-27 ENCOUNTER — Other Ambulatory Visit: Payer: Self-pay | Admitting: Family Medicine

## 2018-08-27 DIAGNOSIS — I10 Essential (primary) hypertension: Secondary | ICD-10-CM

## 2018-08-29 ENCOUNTER — Other Ambulatory Visit: Payer: Self-pay | Admitting: Family Medicine

## 2018-08-29 DIAGNOSIS — I10 Essential (primary) hypertension: Secondary | ICD-10-CM

## 2018-09-02 ENCOUNTER — Telehealth: Payer: Self-pay | Admitting: Family Medicine

## 2018-09-02 NOTE — Telephone Encounter (Signed)
Advised patient we will change him to a telephone visit.Patient verbalized understanding

## 2018-09-03 ENCOUNTER — Ambulatory Visit (INDEPENDENT_AMBULATORY_CARE_PROVIDER_SITE_OTHER): Payer: 59 | Admitting: Family Medicine

## 2018-09-03 ENCOUNTER — Other Ambulatory Visit: Payer: Self-pay

## 2018-09-03 ENCOUNTER — Encounter: Payer: Self-pay | Admitting: Family Medicine

## 2018-09-03 DIAGNOSIS — M160 Bilateral primary osteoarthritis of hip: Secondary | ICD-10-CM | POA: Diagnosis not present

## 2018-09-03 DIAGNOSIS — I10 Essential (primary) hypertension: Secondary | ICD-10-CM

## 2018-09-03 DIAGNOSIS — E8881 Metabolic syndrome: Secondary | ICD-10-CM | POA: Diagnosis not present

## 2018-09-03 DIAGNOSIS — I700 Atherosclerosis of aorta: Secondary | ICD-10-CM

## 2018-09-03 MED ORDER — LISINOPRIL 10 MG PO TABS: 10.0000 mg | ORAL_TABLET | Freq: Every day | ORAL | 1 refills | Status: DC

## 2018-09-03 MED ORDER — ASPIRIN 81 MG PO TBEC: 81.0000 mg | DELAYED_RELEASE_TABLET | Freq: Every day | ORAL | 1 refills | Status: DC

## 2018-09-03 MED ORDER — AMLODIPINE BESYLATE 10 MG PO TABS: 10.0000 mg | ORAL_TABLET | Freq: Every day | ORAL | 1 refills | Status: DC

## 2018-09-03 MED ORDER — HYDROCHLOROTHIAZIDE 12.5 MG PO CAPS: 12.5000 mg | ORAL_CAPSULE | Freq: Every day | ORAL | 1 refills | Status: DC

## 2018-09-03 NOTE — Progress Notes (Signed)
Virtual Visit via telephone Note Due to COVID-19 pandemic this visit was conducted virtually. This visit type was conducted due to national recommendations for restrictions regarding the COVID-19 Pandemic (e.g. social distancing, sheltering in place) in an effort to limit this patient's exposure and mitigate transmission in our community. All issues noted in this document were discussed and addressed.  A physical exam was not performed with this format.   I connected with [name] on 09/03/18 at 1230 by telephone and verified that I am speaking with the correct person using two identifiers. [name] is currently located at home and family is currently with them during visit. The provider, Kari BaarsMichelle , FNP is located in their office at time of visit.  I discussed the limitations, risks, security and privacy concerns of performing an evaluation and management service by telephone and the availability of in person appointments. I also discussed with the patient that there may be a patient responsible charge related to this service. The patient expressed understanding and agreed to proceed.  Subjective:  Patient ID: [name], male    DOB: December 27, 1950, 68 y.o.   MRN: [id number]  Chief Complaint:  Medical Management of Chronic Issues and Hypertension   HPI: [name] is a 68 y.o. male presenting on 09/03/2018 for Medical Management of Chronic Issues and Hypertension   1. Essential hypertension Complaint with meds - Yes Checking BP at home ranging 118/78 Exercising Regularly - No Watching Salt intake - Yes Pertinent ROS:  Headache - No Chest pain - No Dyspnea - No Palpitations - No LE edema - No They report good compliance with medications and can restate their regimen by memory. No medication side effects.  BP Readings from Last 3 Encounters:  01/07/18 114/70  01/02/18 133/74  12/24/17 121/69     2. Aortic atherosclerosis (HCC) Taking ASA 81 mg daily. No chest pain,  abdominal pain, lower extremity temperature changes or paresthesias.   3. Metabolic syndrome Does try to watch diet. Weight staying around 200 lb. Does not exercise on a regular basis due to hip pain.   4. Primary osteoarthritis of both hips Followed by ortho. Had a hip injection, helped with the pain for a while. States the pain is tolerable. He needs surgery and has been putting this off. States he thinks he is going to schedule it soon due to continued pain.    Relevant past medical, surgical, family, and social history reviewed and updated as indicated.  Allergies and medications reviewed and updated.   Past Medical History:  Diagnosis Date  . Hypertension     History reviewed. No pertinent surgical history.  Social History   Socioeconomic History  . Marital status: Married    Spouse name: Not on file  . Number of children: Not on file  . Years of education: Not on file  . Highest education level: Not on file  Occupational History  . Not on file  Social Needs  . Financial resource strain: Not on file  . Food insecurity    Worry: Not on file    Inability: Not on file  . Transportation needs    Medical: Not on file    Non-medical: Not on file  Tobacco Use  . Smoking status: Never Smoker  . Smokeless tobacco: Never Used  Substance and Sexual Activity  . Alcohol use: No  . Drug use: No  . Sexual activity: Not on file  Lifestyle  . Physical activity    Days per week: Not  on file    Minutes per session: Not on file  . Stress: Not on file  Relationships  . Social Musicianconnections    Talks on phone: Not on file    Gets together: Not on file    Attends religious service: Not on file    Active member of club or organization: Not on file    Attends meetings of clubs or organizations: Not on file    Relationship status: Not on file  . Intimate partner violence    Fear of current or ex partner: Not on file    Emotionally abused: Not on file    Physically abused: Not on  file    Forced sexual activity: Not on file  Other Topics Concern  . Not on file  Social History Narrative  . Not on file    Outpatient Encounter Medications as of 09/03/2018  Medication Sig  . amLODipine (NORVASC) 10 MG tablet Take 1 tablet (10 mg total) by mouth daily.  Marland Kitchen. aspirin ([employer] ASPIRIN LOW STRENGTH) 81 MG EC tablet Take 1 tablet (81 mg total) by mouth daily. Swallow whole.  . hydrochlorothiazide (MICROZIDE) 12.5 MG capsule Take 1 capsule (12.5 mg total) by mouth daily. (call to make March appt)  . lisinopril (ZESTRIL) 10 MG tablet Take 1 tablet (10 mg total) by mouth daily.  . [DISCONTINUED] amLODipine (NORVASC) 10 MG tablet TAKE 1 TABLET BY MOUTH EVERY DAY  . [DISCONTINUED] [employer] ASPIRIN LOW STRENGTH 81 MG EC tablet TAKE 1 TABLET BY MOUTH EVERY DAY  . [DISCONTINUED] hydrochlorothiazide (MICROZIDE) 12.5 MG capsule TAKE 1 CAPSULE (12.5 MG TOTAL) BY MOUTH DAILY. (CALL TO MAKE MARCH APPT)  . [DISCONTINUED] lisinopril (ZESTRIL) 10 MG tablet TAKE 1 TABLET BY MOUTH EVERY DAY   No facility-administered encounter medications on file as of 09/03/2018.     No Known Allergies  Review of Systems  Constitutional: Negative for activity change, appetite change, chills, diaphoresis, fatigue, fever and unexpected weight change.  Eyes: Negative for photophobia and visual disturbance.  Respiratory: Negative for cough, chest tightness and shortness of breath.   Cardiovascular: Negative for chest pain, palpitations and leg swelling.  Gastrointestinal: Negative for abdominal distention, abdominal pain, anal bleeding, blood in stool, constipation, diarrhea, nausea, rectal pain and vomiting.  Endocrine: Negative for cold intolerance, heat intolerance, polydipsia, polyphagia and polyuria.  Genitourinary: Negative for decreased urine volume and difficulty urinating.  Musculoskeletal: Positive for arthralgias and gait problem.  Skin: Negative for color change and pallor.  Neurological: Negative for  dizziness, tremors, seizures, syncope, facial asymmetry, speech difficulty, weakness, light-headedness, numbness and headaches.  Hematological: Does not bruise/bleed easily.  Psychiatric/Behavioral: Negative for confusion.  All other systems reviewed and are negative.        Observations/Objective: No vital signs or physical exam, this was a telephone or virtual health encounter.  Pt alert and oriented, answers all questions appropriately, and able to speak in full sentences.    Assessment and Plan: [name] was seen today for medical management of chronic issues and hypertension.  Diagnoses and all orders for this visit:  Essential hypertension Diet and exercise encouraged. Well controlled on current medications. Follow up in October for lab work. Report any persistent high or low readings. -     amLODipine (NORVASC) 10 MG tablet; Take 1 tablet (10 mg total) by mouth daily. -     hydrochlorothiazide (MICROZIDE) 12.5 MG capsule; Take 1 capsule (12.5 mg total) by mouth daily. (call to make March appt) -  lisinopril (ZESTRIL) 10 MG tablet; Take 1 tablet (10 mg total) by mouth daily.  Aortic atherosclerosis (HCC) Continue ASA therapy. Diet and exercise encouraged.  -     aspirin ([employer]S ASPIRIN LOW STRENGTH) 81 MG EC tablet; Take 1 tablet (81 mg total) by mouth daily. Swallow whole.  Metabolic syndrome Diet and exercise encouraged.   Primary osteoarthritis of both hips Follow up with ortho as scheduled.     Follow Up Instructions: Return in about 3 months (around 12/04/2018), or if symptoms worsen or fail to improve, for in office, labs.    I discussed the assessment and treatment plan with the patient. The patient was provided an opportunity to ask questions and all were answered. The patient agreed with the plan and demonstrated an understanding of the instructions.   The patient was advised to call back or seek an in-person evaluation if the symptoms worsen or if the condition  fails to improve as anticipated.  The above assessment and management plan was discussed with the patient. The patient verbalized understanding of and has agreed to the management plan. Patient is aware to call the clinic if symptoms persist or worsen. Patient is aware when to return to the clinic for a follow-up visit. Patient educated on when it is appropriate to go to the emergency department.    I provided 25 minutes of non-face-to-face time during this encounter. The call started at 1230. The call ended at 1255. The other time was used for coordination of care.    Monia Pouch, FNP-C [facility] [address] [place], [place] [address] [phone] 09/03/18

## 2018-10-28 ENCOUNTER — Ambulatory Visit: Payer: Self-pay

## 2018-10-28 ENCOUNTER — Ambulatory Visit (INDEPENDENT_AMBULATORY_CARE_PROVIDER_SITE_OTHER): Payer: 59 | Admitting: Orthopaedic Surgery

## 2018-10-28 ENCOUNTER — Encounter: Payer: Self-pay | Admitting: Orthopaedic Surgery

## 2018-10-28 VITALS — BP 110/67 | HR 59 | Ht 71.0 in | Wt 178.0 lb

## 2018-10-28 DIAGNOSIS — M160 Bilateral primary osteoarthritis of hip: Secondary | ICD-10-CM

## 2018-10-28 DIAGNOSIS — M25551 Pain in right hip: Secondary | ICD-10-CM | POA: Diagnosis not present

## 2018-10-28 DIAGNOSIS — M4807 Spinal stenosis, lumbosacral region: Secondary | ICD-10-CM | POA: Diagnosis not present

## 2018-10-28 NOTE — Progress Notes (Signed)
Office Visit Note   Patient: [name]           Date of Birth: 1950/09/20           MRN: [id number] Visit Date: 10/28/2018              Requested by: Baruch Gouty, [place],  [place] [id number] PCP: Baruch Gouty, FNP   Assessment & Plan: Visit Diagnoses:  1. Pain of right hip joint   2. Primary osteoarthritis of both hips     Plan: [name] has a history of chronic right hip pain with advanced osteoarthritis.  He has had a cortisone injection with temporary relief.  However, he is significantly compromised in his daily activities and sleep.  "I am just miserable".  He like to proceed with a hip replacement.  I discussed that in some detail with him.  He will need a clearance form for his primary care physician.  We have discussed the surgery, hospitalization, expected postoperative course, potential complications and time out of work which could be as long as 3 to 6 months but he notes he has plenty of sick leave time. I am also going to order an MRI scan of his lumbar spine as I suspect he may have stenosis that creates a lot of the right leg pain with numbness and tingling.  He might be a candidate for an epidural steroid injection prior to his hip replacement  Follow-Up Instructions: Return Will schedule right total hip replacement.   Orders:  Orders Placed This Encounter  Procedures  . XR Lumbar Spine 2-3 Views  . XR Pelvis 1-2 Views   No orders of the defined types were placed in this encounter.     Procedures: No procedures performed   Clinical Data: No additional findings.   Subjective: Chief Complaint  Patient presents with  . Right Hip - Pain  Patient presents today with right hip pain. He almost drags his right leg and "walks like a penguin". 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 He had an injection at the end of 2019 at [facility] and states that it did not help him. He will sometimes take  something for pain OTC. He is ready to discuss hip replacement.  [name] relates that his activities of daily living are significantly compromised.  He is just "miserable".  He has experienced some back pain and some tingling in his right leg  HPI  Review of Systems   Objective: Vital Signs: BP 110/67   Pulse (!) 59   Ht 5\' 11"  (1.803 m)   Wt 178 lb (80.7 kg)   BMI 24.83 kg/m   Physical Exam Constitutional:      Appearance: He is well-developed.  Eyes:     Pupils: Pupils are equal, round, and reactive to light.  Pulmonary:     Effort: Pulmonary effort is normal.  Skin:    General: Skin is warm and dry.  Neurological:     Mental Status: He is alert and oriented to person, place, and time.  Psychiatric:        Behavior: Behavior normal.     Ortho Exam awake alert and oriented x3.  Comfortable sitting.  Straight leg raise negative bilaterally.  Considerable loss of motion right hip with little if any internal rotation.  Has about 30 degrees of external rotation and some groin pain.  Has some thigh and calf atrophy related to his chronic discomfort.  No distal edema.  Excellent pulses.  Both feet were warm.  Straight leg raise negative.  No percussible tenderness of lumbar spine  Specialty Comments:  No specialty comments available.  Imaging: No results found.   PMFS History: Patient Active Problem List   Diagnosis Date Noted  . Aortic atherosclerosis (HCC) 12/12/2017  . Primary osteoarthritis of both hips 12/01/2017  . Nocturia 12/01/2017  . Palpable mass of lower back 12/01/2017  . Inguinal lymphadenopathy 12/01/2017  . HTN (hypertension) 07/11/2012  . Metabolic syndrome 07/11/2012   Past Medical History:  Diagnosis Date  . Hypertension     History reviewed. No pertinent family history.  History reviewed. No pertinent surgical history. Social History   Occupational History  . Not on file  Tobacco Use  . Smoking status: Never Smoker  . Smokeless  tobacco: Never Used  Substance and Sexual Activity  . Alcohol use: No  . Drug use: No  . Sexual activity: Not on file

## 2018-11-11 ENCOUNTER — Ambulatory Visit (HOSPITAL_COMMUNITY): Payer: 59

## 2018-11-18 ENCOUNTER — Other Ambulatory Visit: Payer: Self-pay

## 2018-11-18 ENCOUNTER — Ambulatory Visit (HOSPITAL_COMMUNITY)
Admission: RE | Admit: 2018-11-18 | Discharge: 2018-11-18 | Disposition: A | Discharge status: Home / Self Care | Payer: 59 | Source: Ambulatory Visit | Attending: Orthopaedic Surgery | Admitting: Orthopaedic Surgery

## 2018-11-18 DIAGNOSIS — M4807 Spinal stenosis, lumbosacral region: Secondary | ICD-10-CM

## 2018-11-24 ENCOUNTER — Other Ambulatory Visit: Payer: Self-pay

## 2018-11-25 ENCOUNTER — Encounter: Payer: Self-pay | Admitting: Family Medicine

## 2018-11-25 ENCOUNTER — Ambulatory Visit: Payer: 59 | Admitting: Family Medicine

## 2018-11-25 ENCOUNTER — Encounter: Payer: Self-pay | Admitting: Orthopaedic Surgery

## 2018-11-25 ENCOUNTER — Ambulatory Visit (INDEPENDENT_AMBULATORY_CARE_PROVIDER_SITE_OTHER): Payer: 59 | Admitting: Orthopaedic Surgery

## 2018-11-25 VITALS — BP 111/60 | HR 55 | Temp 98.7°F | Resp 20 | Ht 71.0 in | Wt 178.0 lb

## 2018-11-25 VITALS — Ht 71.0 in | Wt 178.0 lb

## 2018-11-25 DIAGNOSIS — I10 Essential (primary) hypertension: Secondary | ICD-10-CM

## 2018-11-25 DIAGNOSIS — M160 Bilateral primary osteoarthritis of hip: Secondary | ICD-10-CM | POA: Diagnosis not present

## 2018-11-25 DIAGNOSIS — E8881 Metabolic syndrome: Secondary | ICD-10-CM

## 2018-11-25 DIAGNOSIS — R9431 Abnormal electrocardiogram [ECG] [EKG]: Secondary | ICD-10-CM

## 2018-11-25 DIAGNOSIS — Z01818 Encounter for other preprocedural examination: Secondary | ICD-10-CM | POA: Diagnosis not present

## 2018-11-25 DIAGNOSIS — I700 Atherosclerosis of aorta: Secondary | ICD-10-CM | POA: Diagnosis not present

## 2018-11-25 NOTE — Progress Notes (Signed)
Subjective:  Patient ID: [name], male    DOB: 03-16-1950, 68 y.o.   MRN: [id number]  Patient Care Team: Baruch Gouty, FNP as PCP - General (Family Medicine)   Chief Complaint:  clearance for surgery (right hip replacement (Dr Durward Fortes) ) and Hypertension (follow up )   HPI: [name] is a 68 y.o. male presenting on 11/25/2018 for clearance for surgery (right hip replacement (Dr Durward Fortes) ) and Hypertension (follow up )   1. Pre-operative clearance Pt is a 68 y.o. male who is here for preoperative clearance for right hip replacement  1) High Risk Cardiac Conditions  1) Recent MI - No.  2) Decompensated Heart Failure - No.  3) Unstable angina - No.  4) Symptomatic arrythmia - No.  5) Sx Valvular Disease - No.  2) Intermediate Risk Factors - DM, CKD, CVA, CHF, CAD - No.  3) Surgery Specific Risk - Intermediate  4) Further Noninvasive evaluation -   1) EKG - Yes.    2. Essential hypertension Complaint with meds - Yes Current Medications - amlodipine, Microzide, lisinopril Checking BP at home ranging 120/80 Exercising Regularly - Yes, walking more, limited due to hip pain. Watching Salt intake - Yes. Has changed diet and has lost 20 pounds.  Pertinent ROS:  Headache - No Fatigue - No Visual Disturbances - No Chest pain - No Dyspnea - No Palpitations - No LE edema - No They report good compliance with medications and can restate their regimen by memory. No medication side effects.  Family, social, and smoking history reviewed.   BP Readings from Last 3 Encounters:  11/25/18 111/60  10/28/18 110/67  01/07/18 114/70   CMP Latest Ref Rng & Units 12/01/2017 08/22/2016 02/25/2015  Glucose 65 - 99 mg/dL 119(H) 105(H) 94  BUN 8 - 27 mg/dL _0 Creatinine 0.76 - 1.27 mg/dL 1.25 1.29(H) 1.19  Sodium 134 - 144 mmol/L 142 144 141  Potassium 3.5 - 5.2 mmol/L 3.8 3.4(L) 4.6  Chloride 96 - 106 mmol/L 103 105 101  CO2 20 - 29 mmol/L 18(L) 22 24  Calcium  8.6 - 10.2 mg/dL 10.5(H) 9.2 9.4  Total Protein 6.0 - 8.5 g/dL 7.4 6.8 6.6  Total Bilirubin 0.0 - 1.2 mg/dL 0.8 0.5 0.8  Alkaline Phos 39 - 117 IU/L 79 79 77  AST 0 - 40 IU/L 37 33 17  ALT 0 - 44 IU/L 30 34 15      3. Aortic atherosclerosis ([facility]) On daily ASA therapy. Not on statin therapy, will discuss after labs result.  4. Primary osteoarthritis of both hips Followed by ortho and is scheduled for right hip replacement. Pt is hopeful this will happen in early November.      Relevant past medical, surgical, family, and social history reviewed and updated as indicated.  Allergies and medications reviewed and updated. Date reviewed: Chart in Epic.   Past Medical History:  Diagnosis Date  . Hypertension     History reviewed. No pertinent surgical history.  Social History   Socioeconomic History  . Marital status: Married    Spouse name: Not on file  . Number of children: Not on file  . Years of education: Not on file  . Highest education level: Not on file  Occupational History  . Not on file  Social Needs  . Financial resource strain: Not on file  . Food insecurity    Worry: Not on file    Inability: Not on  file  . Transportation needs    Medical: Not on file    Non-medical: Not on file  Tobacco Use  . Smoking status: Never Smoker  . Smokeless tobacco: Never Used  Substance and Sexual Activity  . Alcohol use: No  . Drug use: No  . Sexual activity: Not on file  Lifestyle  . Physical activity    Days per week: Not on file    Minutes per session: Not on file  . Stress: Not on file  Relationships  . Social Herbalist on phone: Not on file    Gets together: Not on file    Attends religious service: Not on file    Active member of club or organization: Not on file    Attends meetings of clubs or organizations: Not on file    Relationship status: Not on file  . Intimate partner violence    Fear of current or ex partner: Not on file    Emotionally  abused: Not on file    Physically abused: Not on file    Forced sexual activity: Not on file  Other Topics Concern  . Not on file  Social History Narrative  . Not on file    Outpatient Encounter Medications as of 11/25/2018  Medication Sig  . amLODipine (NORVASC) 10 MG tablet Take 1 tablet (10 mg total) by mouth daily.  Marland Kitchen aspirin ([employer] ASPIRIN LOW STRENGTH) 81 MG EC tablet Take 1 tablet (81 mg total) by mouth daily. Swallow whole.  . hydrochlorothiazide (MICROZIDE) 12.5 MG capsule Take 1 capsule (12.5 mg total) by mouth daily. (call to make March appt)  . lisinopril (ZESTRIL) 10 MG tablet Take 1 tablet (10 mg total) by mouth daily.   No facility-administered encounter medications on file as of 11/25/2018.     No Known Allergies  Review of Systems  Constitutional: Negative for activity change, appetite change, chills, diaphoresis, fatigue, fever and unexpected weight change.  HENT: Negative.   Eyes: Negative.  Negative for photophobia and visual disturbance.  Respiratory: Negative for cough, chest tightness and shortness of breath.   Cardiovascular: Negative for chest pain, palpitations and leg swelling.  Gastrointestinal: Negative for abdominal distention, abdominal pain, anal bleeding, blood in stool, constipation, diarrhea, nausea, rectal pain and vomiting.  Endocrine: Negative.  Negative for polydipsia, polyphagia and polyuria.  Genitourinary: Negative for decreased urine volume, difficulty urinating, dysuria, frequency and urgency.  Musculoskeletal: Positive for arthralgias, back pain and gait problem (due to hip pain). Negative for myalgias, neck pain and neck stiffness.  Skin: Negative.  Negative for color change and pallor.  Allergic/Immunologic: Negative.   Neurological: Negative for dizziness, tremors, seizures, syncope, facial asymmetry, speech difficulty, weakness, light-headedness, numbness and headaches.  Hematological: Negative.  Does not bruise/bleed easily.   Psychiatric/Behavioral: Negative for confusion, hallucinations, sleep disturbance and suicidal ideas.  All other systems reviewed and are negative.       Objective:  BP 111/60   Pulse (!) 55   Temp 98.7 F (37.1 C)   Resp 20   Ht _0  (1.803 m)   Wt 178 lb (80.7 kg)   SpO2 99%   BMI 24.83 kg/m    Wt Readings from Last 3 Encounters:  11/25/18 178 lb (80.7 kg)  11/25/18 178 lb (80.7 kg)  10/28/18 178 lb (80.7 kg)    Physical Exam Vitals signs and nursing note reviewed.  Constitutional:      General: He is not in acute distress.  Appearance: Normal appearance. He is well-developed, well-groomed and normal weight. He is not ill-appearing, toxic-appearing or diaphoretic.  HENT:     Head: Normocephalic and atraumatic.     Jaw: There is normal jaw occlusion.     Right Ear: Hearing normal.     Left Ear: Hearing normal.     Nose: Nose normal.     Mouth/Throat:     Lips: Pink.     Mouth: Mucous membranes are moist.     Pharynx: Oropharynx is clear. Uvula midline.  Eyes:     General: Lids are normal.     Extraocular Movements: Extraocular movements intact.     Conjunctiva/sclera: Conjunctivae normal.     Pupils: Pupils are equal, round, and reactive to light.  Neck:     Musculoskeletal: Normal range of motion and neck supple.     Thyroid: No thyroid mass, thyromegaly or thyroid tenderness.     Vascular: No carotid bruit or JVD.     Trachea: Trachea and phonation normal.  Cardiovascular:     Rate and Rhythm: Regular rhythm. Bradycardia present.     Chest Wall: PMI is not displaced.     Pulses: Normal pulses.     Heart sounds: Normal heart sounds. No murmur. No friction rub. No gallop.   Pulmonary:     Effort: Pulmonary effort is normal. No respiratory distress.     Breath sounds: Normal breath sounds. No wheezing.  Abdominal:     General: Bowel sounds are normal. There is no distension or abdominal bruit.     Palpations: Abdomen is soft. There is no hepatomegaly or  splenomegaly.     Tenderness: There is no abdominal tenderness. There is no right CVA tenderness or left CVA tenderness.     Hernia: No hernia is present.  Musculoskeletal:     Right hip: He exhibits decreased range of motion and tenderness.     Left hip: He exhibits decreased range of motion and tenderness.     Right knee: Normal.     Left knee: Normal.     Thoracic back: Normal.     Lumbar back: He exhibits decreased range of motion. He exhibits no tenderness, no bony tenderness, no swelling, no edema, no deformity, no laceration, no pain, no spasm and normal pulse.     Right upper leg: Normal.     Left upper leg: Normal.     Right lower leg: No edema.     Left lower leg: No edema.  Lymphadenopathy:     Cervical: No cervical adenopathy.  Skin:    General: Skin is warm and dry.     Capillary Refill: Capillary refill takes less than 2 seconds.     Coloration: Skin is not cyanotic, jaundiced or pale.     Findings: No rash.  Neurological:     General: No focal deficit present.     Mental Status: He is alert and oriented to person, place, and time.     Cranial Nerves: Cranial nerves are intact. No cranial nerve deficit.     Sensory: Sensation is intact. No sensory deficit.     Motor: Motor function is intact. No weakness.     Coordination: Coordination is intact. Coordination normal.     Gait: Gait abnormal (antalgic gait).     Deep Tendon Reflexes: Reflexes are normal and symmetric. Reflexes normal.  Psychiatric:        Attention and Perception: Attention and perception normal.        Mood  and Affect: Mood and affect normal.        Speech: Speech normal.        Behavior: Behavior normal. Behavior is cooperative.        Thought Content: Thought content normal.        Cognition and Memory: Cognition and memory normal.        Judgment: Judgment normal.     Results for orders placed or performed in visit on 12/01/17  CBC with Differential/Platelet  Result Value Ref Range   WBC  10.2 3.4 - 10.8 x10E3/uL   RBC 5.35 4.14 - 5.80 x10E6/uL   Hemoglobin 15.9 13.0 - 17.7 g/dL   Hematocrit 46.2 37.5 - 51.0 %   MCV 86 79 - 97 fL   MCH 29.7 26.6 - 33.0 pg   MCHC 34.4 31.5 - 35.7 g/dL   RDW 13.0 12.3 - 15.4 %   Platelets 271 150 - 450 x10E3/uL   Neutrophils 79 Not Estab. %   Lymphs 13 Not Estab. %   Monocytes 7 Not Estab. %   Eos 0 Not Estab. %   Basos 1 Not Estab. %   Neutrophils Absolute 8.0 (H) 1.4 - 7.0 x10E3/uL   Lymphocytes Absolute 1.4 0.7 - 3.1 x10E3/uL   Monocytes Absolute 0.7 0.1 - 0.9 x10E3/uL   EOS (ABSOLUTE) 0.0 0.0 - 0.4 x10E3/uL   Basophils Absolute 0.1 0.0 - 0.2 x10E3/uL   Immature Granulocytes 0 Not Estab. %   Immature Grans (Abs) 0.0 0.0 - 0.1 x10E3/uL  CMP14+EGFR  Result Value Ref Range   Glucose 119 (H) 65 - 99 mg/dL   BUN 20 8 - 27 mg/dL   Creatinine, Ser 1.25 0.76 - 1.27 mg/dL   GFR calc non Af Amer 60 >59 mL/min/1.73   GFR calc Af Amer 69 >59 mL/min/1.73   BUN/Creatinine Ratio 16 10 - 24   Sodium 142 134 - 144 mmol/L   Potassium 3.8 3.5 - 5.2 mmol/L   Chloride 103 96 - 106 mmol/L   CO2 18 (L) 20 - 29 mmol/L   Calcium 10.5 (H) 8.6 - 10.2 mg/dL   Total Protein 7.4 6.0 - 8.5 g/dL   Albumin 4.7 3.6 - 4.8 g/dL   Globulin, Total 2.7 1.5 - 4.5 g/dL   Albumin/Globulin Ratio 1.7 1.2 - 2.2   Bilirubin Total 0.8 0.0 - 1.2 mg/dL   Alkaline Phosphatase 79 39 - 117 IU/L   AST 37 0 - 40 IU/L   ALT 30 0 - 44 IU/L  Lipid panel  Result Value Ref Range   Cholesterol, Total 132 100 - 199 mg/dL   Triglycerides 75 0 - 149 mg/dL   HDL 40 >39 mg/dL   VLDL Cholesterol Cal 15 5 - 40 mg/dL   LDL Calculated 77 0 - 99 mg/dL   Chol/HDL Ratio 3.3 0.0 - 5.0 ratio  TSH  Result Value Ref Range   TSH 3.960 0.450 - 4.500 uIU/mL  PSA, total and free  Result Value Ref Range   Prostate Specific Ag, Serum 1.2 0.0 - 4.0 ng/mL   PSA, Free 0.29 N/A ng/mL   PSA, Free Pct 24.2 %     X-Ray: Chest: No acute findings. Preliminary x-ray reading by Monia Pouch,  FNP-C, [facility].  EKG: First degree AV block with RBBB. Sinus bradycardia. No ST elevation or ectopy. No previous EKG for comparison. Monia Pouch, FNP-C  Pertinent labs & imaging results that were available during my care of the patient were reviewed by me and  considered in my medical decision making.  Assessment & Plan:  [name] was seen today for clearance for surgery and hypertension.  Diagnoses and all orders for this visit:  Pre-operative clearance I have independently evaluated patient.  [name] is a 68 y.o. male who is intermediate risk for an intermediate risk surgery.  Normal CXR Abnormal EKG - no previous EKG for comparison. Will refer to cardiology as he has an abnormal EKG and history of HTN. EKG with RBBB, SB, and first degree AV block. Pt asymptomatic.   Essential hypertension BP well controlled. Changes were not made in regimen today. Goal BP is 130/80. Pt aware to report any persistent high or low readings. DASH diet and exercise encouraged. Exercise at least 150 minutes per week and increase as tolerated. Goal BMI > 25. Stress management encouraged. Avoid nicotine and tobacco product use. Avoid excessive alcohol and NSAID's. Avoid more than 2000 mg of sodium daily. Medications as prescribed. Follow up as scheduled.  -     EKG 12-Lead  Aortic atherosclerosis ([facility]) Diet and exercise encouraged. Continue ASA therapy. Will discuss statin therapy once labs result.  -     EKG 12-Lead  Primary osteoarthritis of both hips Followed by ortho. Scheduled for right hip replacement.     Continue all other maintenance medications.  Follow up plan: Return if symptoms worsen or fail to improve.  Continue healthy lifestyle choices, including diet (rich in fruits, vegetables, and lean proteins, and low in salt and simple carbohydrates) and exercise (at least 30 minutes of moderate physical activity daily).  The above assessment and management plan was discussed with the patient.  The patient verbalized understanding of and has agreed to the management plan. Patient is aware to call the clinic if they develop any new symptoms or if symptoms persist or worsen. Patient is aware when to return to the clinic for a follow-up visit. Patient educated on when it is appropriate to go to the emergency department.   Monia Pouch, FNP-C [facility] [phone]

## 2018-11-25 NOTE — Progress Notes (Signed)
Office Visit Note   Patient: [name]           Date of Birth: [birth year]           MRN: [id number] Visit Date: 11/25/2018              Requested by: Sonny Masters, FNP [address] [place],  [place] [address] PCP: Sonny Masters, FNP   Assessment & Plan: Visit Diagnoses:  1. Primary osteoarthritis of both hips     Plan: MRI scan of the lumbar spine demonstrated left lateral recess impingement at L3-4 which could affect the left L4 nerve.  There was slight compression of the left L5 nerve root sleeve at L4-5 due to hypertrophy of the ligamentum flavum and facet joints there was chronic disc bulge and osteophytes at the right at L5-S1 without focal neural impingement.  Based on the MRI scan I am not sure that he is having much referred pain into his right lower extremity referable to his lumbar spine.  I suspect the majority of his problem is related to the arthritic hip.  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   Orders:  No orders of the defined types were placed in this encounter.  No orders of the defined types were placed in this encounter.     Procedures: No procedures performed   Clinical Data: No additional findings.   Subjective: Chief Complaint  Patient presents with  . Lower Back - Follow-up    MRI results  Patient presents today for follow up on his lower back. He had an MRI on 11/18/2018. He is not taking anything for pain. Mr. Hollibaugh has significant arthritis of his right hip.  Prior to scheduling hip replacement I wanted to be sure he was not experiencing some radicular pain from the lumbar spine.  Thus, the reason for  obtaining the MRI scan results are as above  HPI  Review of Systems   Objective: Vital Signs: Ht 5\' 11"  (1.803 m)   Wt 178 lb (80.7 kg)   BMI 24.83 kg/m   Physical Exam Constitutional:      Appearance: He is well-developed.  Eyes:     Pupils: Pupils are equal, round, and reactive to light.  Pulmonary:     Effort: Pulmonary effort is normal.  Skin:    General: Skin is warm and dry.  Neurological:     Mental Status: He is alert and oriented to person, place, and time.  Psychiatric:        Behavior: Behavior normal.     Ortho Exam no change in exam of his right hip.  There is some pain with internal and external rotation limited motion.  Straight leg raise negative.  Motor and sensory exam intact.  No percussible tenderness of lumbar spine Specialty Comments:  No specialty comments available.  Imaging: No results found.   PMFS History: Patient Active Problem List   Diagnosis Date Noted  . Aortic atherosclerosis ([facility]) 12/12/2017  . Primary osteoarthritis of both hips 12/01/2017  . Nocturia 12/01/2017  . Palpable mass of lower back 12/01/2017  . Inguinal lymphadenopathy 12/01/2017  . HTN (hypertension) 07/11/2012  .  Metabolic syndrome 70/48/8891   Past Medical History:  Diagnosis Date  . Hypertension     History reviewed. No pertinent family history.  History reviewed. No pertinent surgical history. Social History   Occupational History  . Not on file  Tobacco Use  . Smoking status: Never Smoker  . Smokeless tobacco: Never Used  Substance and Sexual Activity  . Alcohol use: No  . Drug use: No  . Sexual activity: Not on file

## 2018-11-26 ENCOUNTER — Telehealth: Payer: Self-pay | Admitting: Family Medicine

## 2018-11-26 ENCOUNTER — Other Ambulatory Visit: Payer: 59

## 2018-11-26 ENCOUNTER — Ambulatory Visit (INDEPENDENT_AMBULATORY_CARE_PROVIDER_SITE_OTHER): Payer: 59

## 2018-11-26 DIAGNOSIS — Z01818 Encounter for other preprocedural examination: Secondary | ICD-10-CM | POA: Diagnosis not present

## 2018-11-26 DIAGNOSIS — I700 Atherosclerosis of aorta: Secondary | ICD-10-CM

## 2018-11-27 LAB — CMP14+EGFR
ALT: 13 IU/L (ref 0–44)
AST: 23 IU/L (ref 0–40)
Albumin/Globulin Ratio: 1.5 (ref 1.2–2.2)
Albumin: 4.2 g/dL (ref 3.8–4.8)
Alkaline Phosphatase: 78 IU/L (ref 39–117)
BUN/Creatinine Ratio: 13 (ref 10–24)
BUN: 14 mg/dL (ref 8–27)
Bilirubin Total: 0.4 mg/dL (ref 0.0–1.2)
CO2: 21 mmol/L (ref 20–29)
Calcium: 9.6 mg/dL (ref 8.6–10.2)
Chloride: 106 mmol/L (ref 96–106)
Creatinine, Ser: 1.1 mg/dL (ref 0.76–1.27)
GFR calc Af Amer: 80 mL/min/{1.73_m2} (ref >59)
GFR calc non Af Amer: 70 mL/min/{1.73_m2} (ref >59)
Globulin, Total: 2.8 g/dL (ref 1.5–4.5)
Glucose: 111 mg/dL — ABNORMAL HIGH (ref 65–99)
Potassium: 3.9 mmol/L (ref 3.5–5.2)
Sodium: 142 mmol/L (ref 134–144)
Total Protein: 7 g/dL (ref 6.0–8.5)

## 2018-11-27 LAB — CBC WITH DIFFERENTIAL/PLATELET
Basophils Absolute: 0.1 10*3/uL (ref 0.0–0.2)
Basos: 1 %
EOS (ABSOLUTE): 0.3 10*3/uL (ref 0.0–0.4)
Eos: 4 %
Hematocrit: 41.8 % (ref 37.5–51.0)
Hemoglobin: 14.5 g/dL (ref 13.0–17.7)
Immature Grans (Abs): 0 10*3/uL (ref 0.0–0.1)
Immature Granulocytes: 0 %
Lymphocytes Absolute: 1.7 10*3/uL (ref 0.7–3.1)
Lymphs: 27 %
MCH: 30.8 pg (ref 26.6–33.0)
MCHC: 34.7 g/dL (ref 31.5–35.7)
MCV: 89 fL (ref 79–97)
Monocytes Absolute: 0.6 10*3/uL (ref 0.1–0.9)
Monocytes: 9 %
Neutrophils Absolute: 3.8 10*3/uL (ref 1.4–7.0)
Neutrophils: 59 %
Platelets: 231 10*3/uL (ref 150–450)
RBC: 4.71 x10E6/uL (ref 4.14–5.80)
RDW: 12.9 % (ref 11.6–15.4)
WBC: 6.4 10*3/uL (ref 3.4–10.8)

## 2018-11-27 LAB — THYROID PANEL WITH TSH
Free Thyroxine Index: 2.1 (ref 1.2–4.9)
T3 Uptake Ratio: 28 % (ref 24–39)
T4, Total: 7.4 ug/dL (ref 4.5–12.0)
TSH: 3.66 u[IU]/mL (ref 0.450–4.500)

## 2018-11-27 LAB — LIPID PANEL
Chol/HDL Ratio: 3.2 ratio (ref 0.0–5.0)
Cholesterol, Total: 134 mg/dL (ref 100–199)
HDL: 42 mg/dL (ref >39)
LDL Chol Calc (NIH): 81 mg/dL (ref 0–99)
Triglycerides: 52 mg/dL (ref 0–149)
VLDL Cholesterol Cal: 11 mg/dL (ref 5–40)

## 2018-11-30 ENCOUNTER — Encounter: Payer: Self-pay | Admitting: Cardiology

## 2018-11-30 ENCOUNTER — Ambulatory Visit: Payer: 59 | Admitting: Cardiology

## 2018-11-30 ENCOUNTER — Other Ambulatory Visit: Payer: Self-pay

## 2018-11-30 VITALS — BP 137/73 | HR 56 | Temp 98.8°F | Ht 71.0 in | Wt 180.0 lb

## 2018-11-30 DIAGNOSIS — R9431 Abnormal electrocardiogram [ECG] [EKG]: Secondary | ICD-10-CM | POA: Diagnosis not present

## 2018-11-30 DIAGNOSIS — R001 Bradycardia, unspecified: Secondary | ICD-10-CM | POA: Diagnosis not present

## 2018-11-30 DIAGNOSIS — I4510 Unspecified right bundle-branch block: Secondary | ICD-10-CM | POA: Diagnosis not present

## 2018-11-30 NOTE — Patient Instructions (Signed)

## 2018-11-30 NOTE — Progress Notes (Signed)
Clinical Summary [name] is a 68 y.o.male seen as new consult, referred by NP [name] for abnormal EKG.    1. Abnormal EKG - EKG from pcp shows first degree AV block, RBBB - no significant symptms. No dizziness or lightheadedness. No CP or SOB  2. Preoperative evaluation - being considered for hip replacement.  - does heavy yard work without issues, walks several hundred yards without troubles.    3. HTN - compliant with meds.    4. Aortic atherosclerosis    Past Medical History:  Diagnosis Date  . Hypertension      No Known Allergies   Current Outpatient Medications  Medication Sig Dispense Refill  . amLODipine (NORVASC) 10 MG tablet Take 1 tablet (10 mg total) by mouth daily. 90 tablet 1  . aspirin ([employer] ASPIRIN LOW STRENGTH) 81 MG EC tablet Take 1 tablet (81 mg total) by mouth daily. Swallow whole. 90 tablet 1  . hydrochlorothiazide (MICROZIDE) 12.5 MG capsule Take 1 capsule (12.5 mg total) by mouth daily. (call to make March appt) 90 capsule 1  . lisinopril (ZESTRIL) 10 MG tablet Take 1 tablet (10 mg total) by mouth daily. 90 tablet 1   No current facility-administered medications for this visit.      No past surgical history on file.   No Known Allergies    No family history on file.   Social History [name] reports that he has never smoked. He has never used smokeless tobacco. [name] reports no history of alcohol use.   Review of Systems CONSTITUTIONAL: No weight loss, fever, chills, weakness or fatigue.  HEENT: Eyes: No visual loss, blurred vision, double vision or yellow sclerae.No hearing loss, sneezing, congestion, runny nose or sore throat.  SKIN: No rash or itching.  CARDIOVASCULAR: per hpi RESPIRATORY: No shortness of breath, cough or sputum.  GASTROINTESTINAL: No anorexia, nausea, vomiting or diarrhea. No abdominal pain or blood.  GENITOURINARY: No burning on urination, no polyuria NEUROLOGICAL: No headache, dizziness,  syncope, paralysis, ataxia, numbness or tingling in the extremities. No change in bowel or bladder control.  MUSCULOSKELETAL: No muscle, back pain, joint pain or stiffness.  LYMPHATICS: No enlarged nodes. No history of splenectomy.  PSYCHIATRIC: No history of depression or anxiety.  ENDOCRINOLOGIC: No reports of sweating, cold or heat intolerance. No polyuria or polydipsia.  Marland Kitchen   Physical Examination Today's Vitals   11/30/18 0833  BP: 137/73  Pulse: (!) 56  Temp: 98.8 F (37.1 C)  TempSrc: Temporal  SpO2: 97%  Weight: 180 lb (81.6 kg)  Height: 5\' 11"  (1.803 m)   Body mass index is 25.1 kg/m.  Gen: resting comfortably, no acute distress HEENT: no scleral icterus, pupils equal round and reactive, no palptable cervical adenopathy,  CV: RRR, no m/r/g, no jvd Resp: Clear to auscultation bilaterally GI: abdomen is soft, non-tender, non-distended, normal bowel sounds, no hepatosplenomegaly MSK: extremities are warm, no edema.  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 Neither findings is specific to suggest underlying structural heart disease. Would not plan for additional testing.  - asymptomatic, continue to monitor over time. Avoid av nodal agents  2. Preoperative evaluation - tolerates over 4 METs regularly without limitations, recommend proceeding with surgery as planned  3. HTN - mildly elevated today, he is quite nervous. Bp's in epic from other visits have been  at goal - continue current meds    F/u 6 months  [name], M.D.

## 2019-01-12 ENCOUNTER — Telehealth: Payer: Self-pay | Admitting: Orthopaedic Surgery

## 2019-01-12 NOTE — Telephone Encounter (Signed)
Patient would like to proceed with right total hip arthroplasty. Patient has been cleared by PCP and Cardiologist.  Date of Jan 26th being held for patient.  Please provide surgery sheet.  Thanks

## 2019-01-12 NOTE — Telephone Encounter (Signed)
Surgery sheet ompleted

## 2019-02-09 ENCOUNTER — Telehealth: Payer: Self-pay | Admitting: Orthopaedic Surgery

## 2019-02-09 NOTE — Telephone Encounter (Signed)
Patient has canceled his right total hip surgery for Jan 26th due to the surge in covid cases.  He will call in the future to reschedule.  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    Pt's cb  [phone]

## 2019-02-09 NOTE — Telephone Encounter (Signed)
Please advise 

## 2019-02-10 NOTE — Telephone Encounter (Signed)
Called patient to discuss. No answer. Left message for him to call me back with a start date for the work note.

## 2019-02-10 NOTE — Telephone Encounter (Signed)
Ok to write note

## 2019-02-11 ENCOUNTER — Encounter: Payer: Self-pay | Admitting: Orthopaedic Surgery

## 2019-02-11 NOTE — Telephone Encounter (Signed)
Patient's wife left message on voice mail stating her husband would need a work note indicating he would be out of work Jan 31st -thru the time in which his surgery is rescheduled.  At this time we are not able to schedule any elective surgery case until further notice.  Any patient scheduled Jan 25th and after will need to be rescheduled.    Patient's cb  [phone]

## 2019-02-11 NOTE — Telephone Encounter (Signed)
Spoke with patient's wife. I will fax the work note to #[phone].

## 2019-02-18 ENCOUNTER — Ambulatory Visit: Payer: 59 | Admitting: Orthopaedic Surgery

## 2019-02-26 ENCOUNTER — Other Ambulatory Visit (HOSPITAL_COMMUNITY): Payer: 59

## 2019-03-02 ENCOUNTER — Inpatient Hospital Stay: Admit: 2019-03-02 | Payer: 59 | Admitting: Orthopaedic Surgery

## 2019-03-02 SURGERY — ARTHROPLASTY, HIP, TOTAL,POSTERIOR APPROACH
Anesthesia: Choice | Site: Hip | Laterality: Right

## 2019-04-01 ENCOUNTER — Telehealth: Payer: Self-pay | Admitting: Orthopaedic Surgery

## 2019-04-01 NOTE — Telephone Encounter (Signed)
Patient's wife Kendal Hymen called to schedule his right total hip surgery for May 18th, 2021. Patient's wife will be able to take some time off in May to help take care of him.  Patient will need another note stating he will remain out of work until surgery and recovery.  Patient says it is ok to use my chart to communicate and he will print it off rather than [place] faxing it.    Any questions please call [phone]

## 2019-04-06 NOTE — Telephone Encounter (Signed)
Are you okay with another work note extending him out of work until May 2021?

## 2019-04-06 NOTE — Telephone Encounter (Signed)
Seems a long time to keep out of work-can he work partime?

## 2019-04-06 NOTE — Telephone Encounter (Signed)
Note has been written and patient and wife notified. Sent to [employer].

## 2019-04-06 NOTE — Telephone Encounter (Signed)
Ok for note 

## 2019-04-06 NOTE — Telephone Encounter (Signed)
Spoke with patient. 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 He states that he is not able to do surgery before May. Please advise.

## 2019-05-03 ENCOUNTER — Other Ambulatory Visit: Payer: Self-pay | Admitting: Family Medicine

## 2019-05-03 ENCOUNTER — Telehealth: Payer: Self-pay | Admitting: Orthopaedic Surgery

## 2019-05-03 DIAGNOSIS — I10 Essential (primary) hypertension: Secondary | ICD-10-CM

## 2019-05-03 NOTE — Telephone Encounter (Signed)
FYI

## 2019-05-03 NOTE — Telephone Encounter (Signed)
Patient is scheduled for right total hip arthroplasty at [facility] on May 25th. Medical and Cardiac clearances obtained the end of December and early January have expired.  Request for surgical clearance sent to Dr. Dina Rich and Gilford Silvius, FNP.

## 2019-05-04 NOTE — Telephone Encounter (Signed)
thanks

## 2019-05-25 ENCOUNTER — Telehealth: Payer: Self-pay

## 2019-05-25 NOTE — Telephone Encounter (Signed)
  Patient Consent for Virtual Visit         [name] has provided verbal consent on 05/25/2019 for a virtual visit (video or telephone).   CONSENT FOR VIRTUAL VISIT FOR:  [name]  By participating in this virtual visit I agree to the following:  I hereby voluntarily request, consent and authorize [facility] and its employed or contracted physicians, physician assistants, nurse practitioners or other licensed health care professionals (the Practitioner), to provide me with telemedicine health care services (the "Services") as deemed necessary by the treating Practitioner. I acknowledge and consent to receive the Services by the Practitioner via telemedicine. I understand that the telemedicine visit will involve communicating with the Practitioner through live audiovisual communication technology and the disclosure of certain medical information by electronic transmission. I acknowledge that I have been given the opportunity to request an in-person assessment or other available alternative prior to the telemedicine visit and am voluntarily participating in the telemedicine visit.  I understand that I have the right to withhold or withdraw my consent to the use of telemedicine in the course of my care at any time, without affecting my right to future care or treatment, and that the Practitioner or I may terminate the telemedicine visit at any time. I understand that I have the right to inspect all information obtained and/or recorded in the course of the telemedicine visit and may receive copies of available information for a reasonable fee.  I understand that some of the potential risks of receiving the Services via telemedicine include:  Marland Kitchen Delay or interruption in medical evaluation due to technological equipment failure or disruption; . Information transmitted may not be sufficient (e.g. poor resolution of images) to allow for appropriate medical decision making by the Practitioner;  and/or  . In rare instances, security protocols could fail, causing a breach of personal health information.  Furthermore, I acknowledge that it is my responsibility to provide information about my medical history, conditions and care that is complete and accurate to the best of my ability. I acknowledge that Practitioner's advice, recommendations, and/or decision may be based on factors not within their control, such as incomplete or inaccurate data provided by me or distortions of diagnostic images or specimens that may result from electronic transmissions. I understand that the practice of medicine is not an exact science and that Practitioner makes no warranties or guarantees regarding treatment outcomes. I acknowledge that a copy of this consent can be made available to me via my patient portal [facility] MyChart), or I can request a printed copy by calling the office of [facility].    I understand that my insurance will be billed for this visit.   I have read or had this consent read to me. . I understand the contents of this consent, which adequately explains the benefits and risks of the Services being provided via telemedicine.  . I have been provided ample opportunity to ask questions regarding this consent and the Services and have had my questions answered to my satisfaction. . I give my informed consent for the services to be provided through the use of telemedicine in my medical care

## 2019-05-26 ENCOUNTER — Encounter: Payer: Self-pay | Admitting: Cardiology

## 2019-05-26 ENCOUNTER — Telehealth (INDEPENDENT_AMBULATORY_CARE_PROVIDER_SITE_OTHER): Payer: 59 | Admitting: Cardiology

## 2019-05-26 VITALS — BP 127/77 | HR 58 | Ht 71.0 in | Wt 200.0 lb

## 2019-05-26 DIAGNOSIS — R9431 Abnormal electrocardiogram [ECG] [EKG]: Secondary | ICD-10-CM

## 2019-05-26 DIAGNOSIS — I10 Essential (primary) hypertension: Secondary | ICD-10-CM | POA: Diagnosis not present

## 2019-05-26 DIAGNOSIS — I4510 Unspecified right bundle-branch block: Secondary | ICD-10-CM

## 2019-05-26 DIAGNOSIS — Z01810 Encounter for preprocedural cardiovascular examination: Secondary | ICD-10-CM | POA: Diagnosis not present

## 2019-05-26 NOTE — Patient Instructions (Signed)

## 2019-05-26 NOTE — Progress Notes (Signed)
Virtual Visit via Telephone Note   This visit type was conducted due to national recommendations for restrictions regarding the COVID-19 Pandemic (e.g. social distancing) in an effort to limit this patient's exposure and mitigate transmission in our community.  Due to his co-morbid illnesses, this patient is at least at moderate risk for complications without adequate follow up.  This format is felt to be most appropriate for this patient at this time.  The patient did not have access to video technology/had technical difficulties with video requiring transitioning to audio format only (telephone).  All issues noted in this document were discussed and addressed.  No physical exam could be performed with this format.  Please refer to the patient's chart for his  consent to telehealth for [facility].   The patient was identified using 2 identifiers.  Date:  05/26/2019   ID:  [name], DOB [birth year], MRN [id number]  Patient Location: Home Provider Location: Office  PCP:  No primary care provider on file.  Cardiologist:  Dr Carlyle Dolly Electrophysiologist:  None   Evaluation Performed:  Follow-Up Visit  Chief Complaint:  Follow up visit  History of Present Illness:    [name] is a 69 y.o. male seen today for follow up of the following medical problem.s    1. Abnormal EKG - EKG from pcp shows first degree AV block, RBBB - no dizziness or lightheadnedess  2. Preoperative evaluation - being considered for hip replacement. Surgery delayed initially due to COVID limitations.   - continues to tolerate heavy [place] without cardiopulmonary symptoms. 2 hours of weed eating with walking. Can walk up flights of stairs without symptoms. Walks several hundred yards daily without troubles.   3. HTN -he is compliant with meds   4. 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    Past Medical History:  Diagnosis Date  . Hypertension    No past surgical history on file.   No outpatient medications have been marked as taking for the 05/26/19 encounter (Appointment) with Arnoldo Lenis, MD.     Allergies:   Patient has no known allergies.   Social History   Tobacco Use  . Smoking status: Never Smoker  . Smokeless tobacco: Never Used  Substance Use Topics  . Alcohol use: No  . Drug use: No     Family Hx: The patient's family history is not on file.  ROS:   Please see the history of present illness.     All other systems reviewed and are negative.   Prior CV studies:   The following studies were reviewed today:   Labs/Other Tests and Data Reviewed:    EKG:  No ECG reviewed.  Recent Labs: 11/26/2018: ALT 13; BUN 14; Creatinine, Ser 1.10; Hemoglobin 14.5; Platelets 231; Potassium 3.9; Sodium 142; TSH 3.660   Recent Lipid Panel Lab Results  Component Value Date/Time   CHOL 134 11/26/2018 09:19 AM   TRIG 52 11/26/2018 09:19 AM   HDL 42 11/26/2018 09:19 AM   CHOLHDL 3.2 11/26/2018 09:19 AM   LDLCALC 81 11/26/2018 09:19 AM    Wt Readings from Last 3 Encounters:  11/30/18 180 lb (81.6 kg)  11/25/18 178 lb (80.7 kg)  11/25/18 178 lb (80.7 kg)     Objective:    Vital Signs:   Vitals:  05/26/19 0838  BP: 127/77  Pulse: (!) 58   Normal affect. Normal speech pattern and tone. Comfortable, no apparent distress. No audible signs of sob or wheezing.   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 Neither findings is specific to suggest underlying structural heart disease.  - no symptoms, continue to monitor at this time.   2. Preoperative evaluation - tolerates over 4 METs regularly without limitations - continue to recommend proceeding with surgery as planned  3. HTN - at goal, continue current meds  COVID-19  Education: The signs and symptoms of COVID-19 were discussed with the patient and how to seek care for testing (follow up with PCP or arrange E-visit).  The importance of social distancing was discussed today.  Time:   Today, I have spent 21 minutes with the patient with telehealth technology discussing the above problems.     Medication Adjustments/Labs and Tests Ordered: Current medicines are reviewed at length with the patient today.  Concerns regarding medicines are outlined above.   Tests Ordered: No orders of the defined types were placed in this encounter.   Medication Changes: No orders of the defined types were placed in this encounter.   Follow Up:  In Person in 1 year(s)  Signed, Dina Rich, MD  05/26/2019 7:44 AM    [facility]

## 2019-06-09 ENCOUNTER — Ambulatory Visit (INDEPENDENT_AMBULATORY_CARE_PROVIDER_SITE_OTHER): Payer: 59

## 2019-06-09 ENCOUNTER — Ambulatory Visit: Payer: 59 | Admitting: Family Medicine

## 2019-06-09 ENCOUNTER — Other Ambulatory Visit: Payer: Self-pay

## 2019-06-09 ENCOUNTER — Encounter: Payer: Self-pay | Admitting: Family Medicine

## 2019-06-09 DIAGNOSIS — R0789 Other chest pain: Secondary | ICD-10-CM | POA: Diagnosis not present

## 2019-06-09 DIAGNOSIS — I10 Essential (primary) hypertension: Secondary | ICD-10-CM | POA: Diagnosis not present

## 2019-06-09 DIAGNOSIS — R0781 Pleurodynia: Secondary | ICD-10-CM

## 2019-06-09 MED ORDER — AMLODIPINE BESYLATE 10 MG PO TABS: 11.0000 mg | ORAL_TABLET | Freq: Every day | ORAL | 1 refills | Status: DC

## 2019-06-09 MED ORDER — LISINOPRIL 10 MG PO TABS: 10.0000 mg | ORAL_TABLET | Freq: Every day | ORAL | 1 refills | Status: DC

## 2019-06-09 MED ORDER — HYDROCHLOROTHIAZIDE 12.5 MG PO CAPS: 12.5000 mg | ORAL_CAPSULE | Freq: Every day | ORAL | 1 refills | Status: DC
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
## 2019-06-15 ENCOUNTER — Other Ambulatory Visit: Payer: Self-pay

## 2019-06-15 ENCOUNTER — Ambulatory Visit: Payer: 59 | Admitting: Orthopaedic Surgery

## 2019-06-15 ENCOUNTER — Encounter: Payer: Self-pay | Admitting: Orthopaedic Surgery

## 2019-06-15 VITALS — BP 133/75 | HR 81 | Ht 71.0 in | Wt 195.0 lb

## 2019-06-15 DIAGNOSIS — M160 Bilateral primary osteoarthritis of hip: Secondary | ICD-10-CM

## 2019-06-15 DIAGNOSIS — M1611 Unilateral primary osteoarthritis, right hip: Secondary | ICD-10-CM

## 2019-06-15 NOTE — Progress Notes (Signed)
Office Visit Note   Patient: [name]           Date of Birth: 01-10-1951           MRN: [id number] Visit Date: 06/15/2019              Requested by: Claretta Fraise, MD [place],  [place] [id number] PCP: Claretta Fraise, MD  Chief Complaint: right hip pain  HPI: [name], 69 y.o. male, has a history of pain and functional disability in the right hip(s) due to arthritis and patient has failed non-surgical conservative treatments for greater than 12 weeks to include NSAID's and/or analgesics, weight reduction as appropriate and activity modification.  Onset of symptoms was gradual starting 5 years ago with gradually worsening course since that time.The patient noted no past surgery on the right hip(s).  Patient currently rates pain in the right hip at 7 out of 10 with activity. Patient has night pain, worsening of pain with activity and weight bearing, trendelenberg gait and pain that interfers with activities of daily living. Patient has evidence of subchondral cysts, subchondral sclerosis, periarticular osteophytes and joint space narrowing by imaging studies. This condition presents safety issues increasing the risk of falls. There is no current active infection.  Patient Active Problem List   Diagnosis Date Noted  . Aortic atherosclerosis ([place]) 12/12/2017  . Primary osteoarthritis of both hips 12/01/2017  . Nocturia 12/01/2017  . Palpable mass of lower back 12/01/2017  . Inguinal lymphadenopathy 12/01/2017  . HTN (hypertension) 07/11/2012  . Metabolic syndrome 98/33/8250   Past Medical History:  Diagnosis Date  . Hypertension     No past surgical history on file.  No current facility-administered medications for this encounter.   Current Outpatient Medications  Medication Sig Dispense Refill Last Dose  . aspirin ([employer] ASPIRIN LOW STRENGTH) 81 MG EC tablet Take 1 tablet (81 mg total) by mouth daily. Swallow whole. 90 tablet 1   . amLODipine (NORVASC) 10 MG tablet  Take 1 tablet (10 mg total) by mouth daily. 90 tablet 1   . hydrochlorothiazide (MICROZIDE) 12.5 MG capsule Take 1 capsule (12.5 mg total) by mouth daily. (call to make March appt) 90 capsule 1   . lisinopril (ZESTRIL) 10 MG tablet Take 1 tablet (10 mg total) by mouth daily. 90 tablet 1    No Known Allergies  Social History   Tobacco Use  . Smoking status: Never Smoker  . Smokeless tobacco: Never Used  Substance Use Topics  . Alcohol use: No    No family history on file.   Review of Systems  Constitutional: Negative for fatigue.  HENT: Negative for ear pain.   Eyes: Negative for pain.  Respiratory: Negative for shortness of breath.   Cardiovascular: Negative for leg swelling.  Gastrointestinal: Negative for constipation and diarrhea.  Endocrine: Negative for cold intolerance and heat intolerance.  Genitourinary: Negative for difficulty urinating.  Musculoskeletal: Negative for joint swelling.  Skin: Negative for rash.  Allergic/Immunologic: Negative for food allergies.  Neurological: Negative for weakness.  Hematological: Bruises/bleeds easily.  Psychiatric/Behavioral: Positive for sleep disturbance.   Objective:  Physical Exam  Constitutional: He is oriented to person, place, and time. He appears well-developed and well-nourished.  HENT:  Head: Normocephalic and atraumatic.  Eyes: Pupils are equal, round, and reactive to light. EOM are normal.  Cardiovascular: Normal rate, regular rhythm and intact distal pulses.  Murmur heard. Grade I-II LSB  Respiratory: Effort normal and breath sounds normal. No respiratory  distress. He has no wheezes.  GI: Soft. Bowel sounds are normal. There is no abdominal tenderness.  Musculoskeletal:     Cervical back: Neck supple.  Neurological: He is alert and oriented to person, place, and time.  Skin: Skin is warm and dry.  Psychiatric: He has a normal mood and affect. His behavior is normal. Judgment and thought content normal.  : Exam  today reveals limited internal and external rotation.  When I get him to around 80 degrees of forward flexion of the hip he is around 20 degrees externally rotated.  He has no internal rotation at that point.  Vital signs in last 24 hours: Pulse Rate:  [81] 81 (05/11 1249) BP: (133)/(75) 133/75 (05/11 1249) Weight:  [88.5 kg] 88.5 kg (05/11 1249)  Labs:   Estimated body mass index is 27.2 kg/m as calculated from the following:   Height as of 06/15/19: 5\' 11"  (1.803 m).   Weight as of 06/15/19: 88.5 kg.   Imaging Review Plain radiographs demonstrate severe degenerative joint disease of the right hip(s). The bone quality appears to be good for age and reported activity level.  AP pelvis demonstrates advanced osteoarthritis the right hip. There has  been some progression from the 2019 films with more of a decrease in the  joint space superiorly associated with subchondral cysts on both sides of  the joint and peripheral osteophytes. There is an area of decreased  density along the proximal femoral shaft that had been previously imaged  and determined to be benign       Assessment/Plan:  End stage arthritis, right hip(s)  The patient history, physical examination, clinical judgement of the provider and imaging studies are consistent with end stage degenerative joint disease of the right hip(s) and total hip arthroplasty is deemed medically necessary. The treatment options including medical management, injection therapy, arthroscopy and arthroplasty were discussed at length. The risks and benefits of total hip arthroplasty were presented and reviewed. The risks due to aseptic loosening, infection, stiffness, dislocation/subluxation,  thromboembolic complications and other imponderables were discussed.  The patient acknowledged the explanation, agreed to proceed with the plan and consent was signed. Patient is being admitted for inpatient treatment for surgery, pain control, PT, OT,  prophylactic antibiotics, VTE prophylaxis, progressive ambulation and ADL's and discharge planning.The patient is planning to be discharged home with home health services  Face-to-face time spent with patient was greater than 40 minutes.  Greater than 50% of the time was spent in counseling and coordination of care.   2020 Oris Drone Daphine Deutscher  06/15/2019 2:21 PM

## 2019-06-15 NOTE — H&P (Signed)
TOTAL HIP ADMISSION H&P  Patient is admitted for right total hip arthroplasty.  Subjective:  Chief Complaint: right hip pain  HPI: Kay Ricciuti, 69 y.o. male, has a history of pain and functional disability in the right hip(s) due to arthritis and patient has failed non-surgical conservative treatments for greater than 12 weeks to include NSAID's and/or analgesics, weight reduction as appropriate and activity modification.  Onset of symptoms was gradual starting 5 years ago with gradually worsening course since that time.The patient noted no past surgery on the right hip(s).  Patient currently rates pain in the right hip at 7 out of 10 with activity. Patient has night pain, worsening of pain with activity and weight bearing, trendelenberg gait and pain that interfers with activities of daily living. Patient has evidence of subchondral cysts, subchondral sclerosis, periarticular osteophytes and joint space narrowing by imaging studies. This condition presents safety issues increasing the risk of falls. There is no current active infection.  Patient Active Problem List   Diagnosis Date Noted  . Aortic atherosclerosis ([facility]) 12/12/2017  . Primary osteoarthritis of both hips 12/01/2017  . Nocturia 12/01/2017  . Palpable mass of lower back 12/01/2017  . Inguinal lymphadenopathy 12/01/2017  . HTN (hypertension) 07/11/2012  . Metabolic syndrome 07/11/2012   Past Medical History:  Diagnosis Date  . Hypertension     No past surgical history on file.  No current facility-administered medications for this encounter.   Current Outpatient Medications  Medication Sig Dispense Refill Last Dose  . aspirin ([employer] ASPIRIN LOW STRENGTH) 81 MG EC tablet Take 1 tablet (81 mg total) by mouth daily. Swallow whole. 90 tablet 1   . amLODipine (NORVASC) 10 MG tablet Take 1 tablet (10 mg total) by mouth daily. 90 tablet 1   . hydrochlorothiazide (MICROZIDE) 12.5 MG capsule Take 1 capsule (12.5 mg total) by mouth  daily. (call to make March appt) 90 capsule 1   . lisinopril (ZESTRIL) 10 MG tablet Take 1 tablet (10 mg total) by mouth daily. 90 tablet 1    No Known Allergies  Social History   Tobacco Use  . Smoking status: Never Smoker  . Smokeless tobacco: Never Used  Substance Use Topics  . Alcohol use: No    No family history on file.   Review of Systems  Constitutional: Negative for fatigue.  HENT: Negative for ear pain.   Eyes: Negative for pain.  Respiratory: Negative for shortness of breath.   Cardiovascular: Negative for leg swelling.  Gastrointestinal: Negative for constipation and diarrhea.  Endocrine: Negative for cold intolerance and heat intolerance.  Genitourinary: Negative for difficulty urinating.  Musculoskeletal: Negative for joint swelling.  Skin: Negative for rash.  Allergic/Immunologic: Negative for food allergies.  Neurological: Negative for weakness.  Hematological: Bruises/bleeds easily.  Psychiatric/Behavioral: Positive for sleep disturbance.   Objective:  Physical Exam  Constitutional: He is oriented to person, place, and time. He appears well-developed and well-nourished.  HENT:  Head: Normocephalic and atraumatic.  Eyes: Pupils are equal, round, and reactive to light. EOM are normal.  Cardiovascular: Normal rate, regular rhythm and intact distal pulses.  Murmur heard. Grade I-II LSB  Respiratory: Effort normal and breath sounds normal. No respiratory distress. He has no wheezes.  GI: Soft. Bowel sounds are normal. There is no abdominal tenderness.  Musculoskeletal:     Cervical back: Neck supple.  Neurological: He is alert and oriented to person, place, and time.  Skin: Skin is warm and dry.  Psychiatric: He has a normal mood  and affect. His behavior is normal. Judgment and thought content normal.  Ortho Exam today reveals limited internal and external rotation.  When I get him to around 80 degrees of forward flexion of the hip he is around 20 degrees  externally rotated.  He has no internal rotation at that point.  Vital signs in last 24 hours: Pulse Rate:  [81] 81 (05/11 1249) BP: (133)/(75) 133/75 (05/11 1249) Weight:  [88.5 kg] 88.5 kg (05/11 1249)  Labs:   Estimated body mass index is 27.2 kg/m as calculated from the following:   Height as of 06/15/19: 5\' 11"  (1.803 m).   Weight as of 06/15/19: 88.5 kg.   Imaging Review Plain radiographs demonstrate severe degenerative joint disease of the right hip(s). The bone quality appears to be good for age and reported activity level.  AP pelvis demonstrates advanced osteoarthritis the right hip. There has  been some progression from the 2019 films with more of a decrease in the  joint space superiorly associated with subchondral cysts on both sides of  the joint and peripheral osteophytes. There is an area of decreased  density along the proximal femoral shaft that had been previously imaged  and determined to be benign       Assessment/Plan:  End stage arthritis, right hip(s)  The patient history, physical examination, clinical judgement of the provider and imaging studies are consistent with end stage degenerative joint disease of the right hip(s) and total hip arthroplasty is deemed medically necessary. The treatment options including medical management, injection therapy, arthroscopy and arthroplasty were discussed at length. The risks and benefits of total hip arthroplasty were presented and reviewed. The risks due to aseptic loosening, infection, stiffness, dislocation/subluxation,  thromboembolic complications and other imponderables were discussed.  The patient acknowledged the explanation, agreed to proceed with the plan and consent was signed. Patient is being admitted for inpatient treatment for surgery, pain control, PT, OT, prophylactic antibiotics, VTE prophylaxis, progressive ambulation and ADL's and discharge planning.The patient is planning to be discharged home with  home health services  Mike Craze. Micheal Likens [phone]  06/15/2019 2:21 PM

## 2019-06-18 NOTE — Patient Instructions (Addendum)
DUE TO COVID-19 ONLY ONE VISITOR IS ALLOWED TO COME WITH YOU AND STAY IN THE WAITING ROOM ONLY DURING PRE OP AND PROCEDURE DAY OF SURGERY. THE 1 VISITOR MAY VISIT WITH YOU AFTER SURGERY IN YOUR PRIVATE ROOM DURING VISITING HOURS ONLY!  YOU NEED TO HAVE A COVID 19 TEST ON: 06/25/19 @  3:35 pm , THIS TEST MUST BE DONE BEFORE SURGERY, COME   [facility]. ONCE YOUR COVID TEST IS COMPLETED, PLEASE BEGIN THE QUARANTINE INSTRUCTIONS AS OUTLINED IN YOUR HANDOUT.                [name]    Your procedure is scheduled on: 06/29/19   Report to [facility] Main  Entrance   Report to short stay at: 5:30 AM     Call this number if you have problems the morning of surgery [phone]    Remember:  NO SOLID FOOD AFTER MIDNIGHT THE NIGHT PRIOR TO SURGERY. NOTHING BY MOUTH EXCEPT CLEAR LIQUIDS UNTIL: 4:15 am . PLEASE FINISH ENSURE DRINK PER SURGEON ORDER  WHICH NEEDS TO BE COMPLETED AT: 4:15 am .    CLEAR LIQUID DIET   Foods Allowed                                                                     Foods Excluded  Coffee and tea, regular and decaf                             liquids that you cannot  Plain Jell-O any favor except red or purple                                           see through such as: Fruit ices (not with fruit pulp)                                     milk, soups, orange juice  Iced Popsicles                                    All solid food Carbonated beverages, regular and diet                                    Cranberry, grape and apple juices Sports drinks like Gatorade Lightly seasoned clear broth or consume(fat free) Sugar, honey syrup  Sample Menu Breakfast                                Lunch                                     Supper Cranberry juice  Beef broth                            Chicken broth Jell-O                                     Grape juice                           Apple juice Coffee or tea                         Jell-O                                      Popsicle                                                Coffee or tea                        Coffee or tea  _____________________________________________________________________    BRUSH YOUR TEETH MORNING OF SURGERY AND RINSE YOUR MOUTH OUT, NO CHEWING GUM CANDY OR MINTS.     Take these medicines the morning of surgery with A SIP OF WATER: amlodipine.                                 You may not have any metal on your body including hair pins and              piercings  Do not wear jewelry, lotions, powders or perfumes, deodorant             Men may shave face and neck.   Do not bring valuables to the hospital. [name] IS NOT             RESPONSIBLE   FOR VALUABLES.  Contacts, dentures or bridgework may not be worn into surgery.  Leave suitcase in the car. After surgery it may be brought to your room.     Patients discharged the day of surgery will not be allowed to drive home. IF YOU ARE HAVING SURGERY AND GOING HOME THE SAME DAY, YOU MUST HAVE AN ADULT TO DRIVE YOU HOME AND BE WITH YOU FOR 24 HOURS. YOU MAY GO HOME BY TAXI OR UBER OR ORTHERWISE, BUT AN ADULT MUST ACCOMPANY YOU HOME AND STAY WITH YOU FOR 24 HOURS.  Name and phone number of your driver:  Special Instructions: N/A              Please read over the following fact sheets you were given: _____________________________________________________________________  [facility] - Preparing for Surgery Before surgery, you can play an important role.  Because skin is not sterile, your skin needs to be as free of germs as possible.  You can reduce the number of germs on your skin by washing with CHG (chlorahexidine gluconate) soap before surgery.  CHG is an antiseptic cleaner which kills germs and bonds with the skin to continue killing germs even after washing. Please  DO NOT use if you have an allergy to CHG or antibacterial soaps.  If your skin becomes reddened/irritated stop using  the CHG and inform your nurse when you arrive at Short Stay. Do not shave (including legs and underarms) for at least 48 hours prior to the first CHG shower.  You may shave your face/neck. Please follow these instructions carefully:  1.  Shower with CHG Soap the night before surgery and the  morning of Surgery.  2.  If you choose to wash your hair, wash your hair first as usual with your  normal  shampoo.  3.  After you shampoo, rinse your hair and body thoroughly to remove the  shampoo.                           4.  Use CHG as you would any other liquid soap.  You can apply chg directly  to the skin and wash                       Gently with a scrungie or clean washcloth.  5.  Apply the CHG Soap to your body ONLY FROM THE NECK DOWN.   Do not use on face/ open                           Wound or open sores. Avoid contact with eyes, ears mouth and genitals (private parts).                       Wash face,  Genitals (private parts) with your normal soap.             6.  Wash thoroughly, paying special attention to the area where your surgery  will be performed.  7.  Thoroughly rinse your body with warm water from the neck down.  8.  DO NOT shower/wash with your normal soap after using and rinsing off  the CHG Soap.                9.  Pat yourself dry with a clean towel.            10.  Wear clean pajamas.            11.  Place clean sheets on your bed the night of your first shower and do not  sleep with pets. Day of Surgery : Do not apply any lotions/deodorants the morning of surgery.  Please wear clean clothes to the hospital/surgery center.  FAILURE TO FOLLOW THESE INSTRUCTIONS MAY RESULT IN THE CANCELLATION OF YOUR SURGERY PATIENT SIGNATURE_________________________________  NURSE SIGNATURE__________________________________  ________________________________________________________________________   [name]  An incentive spirometer is a tool that can help keep your lungs  clear and active. This tool measures how well you are filling your lungs with each breath. Taking long deep breaths may help reverse or decrease the chance of developing breathing (pulmonary) problems (especially infection) following:  A long period of time when you are unable to move or be active. BEFORE THE PROCEDURE   If the spirometer includes an indicator to show your best effort, your nurse or respiratory therapist will set it to a desired goal.  If possible, sit up straight or lean slightly forward. Try not to slouch.  Hold the incentive spirometer in an upright position. INSTRUCTIONS FOR USE  1. Sit on the edge of your bed  if possible, or sit up as far as you can in bed or on a chair. 2. Hold the incentive spirometer in an upright position. 3. Breathe out normally. 4. Place the mouthpiece in your mouth and seal your lips tightly around it. 5. Breathe in slowly and as deeply as possible, raising the piston or the ball toward the top of the column. 6. Hold your breath for 3-5 seconds or for as long as possible. Allow the piston or ball to fall to the bottom of the column. 7. Remove the mouthpiece from your mouth and breathe out normally. 8. Rest for a few seconds and repeat Steps 1 through 7 at least 10 times every 1-2 hours when you are awake. Take your time and take a few normal breaths between deep breaths. 9. The spirometer may include an indicator to show your best effort. Use the indicator as a goal to work toward during each repetition. 10. After each set of 10 deep breaths, practice coughing to be sure your lungs are clear. If you have an incision (the cut made at the time of surgery), support your incision when coughing by placing a pillow or rolled up towels firmly against it. Once you are able to get out of bed, walk around indoors and cough well. You may stop using the incentive spirometer when instructed by your caregiver.  RISKS AND COMPLICATIONS  Take your time so you do  not get dizzy or light-headed.  If you are in pain, you may need to take or ask for pain medication before doing incentive spirometry. It is harder to take a deep breath if you are having pain. AFTER USE  Rest and breathe slowly and easily.  It can be helpful to keep track of a log of your progress. Your caregiver can provide you with a simple table to help with this. If you are using the spirometer at home, follow these instructions: [place] IF:   You are having difficultly using the spirometer.  You have trouble using the spirometer as often as instructed.  Your pain medication is not giving enough relief while using the spirometer.  You develop fever of 100.5 F (38.1 C) or higher. SEEK IMMEDIATE MEDICAL CARE IF:   You cough up bloody sputum that had not been present before.  You develop fever of 102 F (38.9 C) or greater.  You develop worsening pain at or near the incision site. MAKE SURE YOU:   Understand these instructions.  Will watch your condition.  Will get help right away if you are not doing well or get worse. Document Released: 06/03/2006 Document Revised: 04/15/2011 Document Reviewed: 08/04/2006 [facility] Patient Information 2014 [place], [place].   ________________________________________________________________________

## 2019-06-21 ENCOUNTER — Other Ambulatory Visit: Payer: Self-pay

## 2019-06-21 ENCOUNTER — Encounter (HOSPITAL_COMMUNITY)
Admission: RE | Admit: 2019-06-21 | Discharge: 2019-06-21 | Disposition: A | Discharge status: Home / Self Care | Payer: 59 | Source: Ambulatory Visit | Attending: Orthopaedic Surgery | Admitting: Orthopaedic Surgery

## 2019-06-21 ENCOUNTER — Telehealth: Payer: Self-pay | Admitting: Cardiology

## 2019-06-21 ENCOUNTER — Other Ambulatory Visit: Payer: Self-pay | Admitting: *Deleted

## 2019-06-21 ENCOUNTER — Encounter (HOSPITAL_COMMUNITY): Payer: Self-pay

## 2019-06-21 DIAGNOSIS — Z01818 Encounter for other preprocedural examination: Secondary | ICD-10-CM | POA: Insufficient documentation

## 2019-06-21 DIAGNOSIS — R011 Cardiac murmur, unspecified: Secondary | ICD-10-CM

## 2019-06-21 LAB — CBC WITH DIFFERENTIAL/PLATELET
Abs Immature Granulocytes: 0.02 10*3/uL (ref 0.00–0.07)
Basophils Absolute: 0.1 10*3/uL (ref 0.0–0.1)
Basophils Relative: 1 %
Eosinophils Absolute: 0.1 10*3/uL (ref 0.0–0.5)
Eosinophils Relative: 2 %
HCT: 47.3 % (ref 39.0–52.0)
Hemoglobin: 16 g/dL (ref 13.0–17.0)
Immature Granulocytes: 0 %
Lymphocytes Relative: 18 %
Lymphs Abs: 1.5 10*3/uL (ref 0.7–4.0)
MCH: 30.2 pg (ref 26.0–34.0)
MCHC: 33.8 g/dL (ref 30.0–36.0)
MCV: 89.2 fL (ref 80.0–100.0)
Monocytes Absolute: 0.7 10*3/uL (ref 0.1–1.0)
Monocytes Relative: 9 %
Neutro Abs: 5.9 10*3/uL (ref 1.7–7.7)
Neutrophils Relative %: 70 %
Platelets: 245 10*3/uL (ref 150–400)
RBC: 5.3 MIL/uL (ref 4.22–5.81)
RDW: 12.5 % (ref 11.5–15.5)
WBC: 8.4 10*3/uL (ref 4.0–10.5)
nRBC: 0 % (ref 0.0–0.2)

## 2019-06-21 LAB — COMPREHENSIVE METABOLIC PANEL
ALT: 25 U/L (ref 0–44)
AST: 28 U/L (ref 15–41)
Albumin: 4.4 g/dL (ref 3.5–5.0)
Alkaline Phosphatase: 88 U/L (ref 38–126)
Anion gap: 11 (ref 5–15)
BUN: 18 mg/dL (ref 8–23)
CO2: 26 mmol/L (ref 22–32)
Calcium: 9.8 mg/dL (ref 8.9–10.3)
Chloride: 105 mmol/L (ref 98–111)
Creatinine, Ser: 1.36 mg/dL — ABNORMAL HIGH (ref 0.61–1.24)
GFR calc Af Amer: >60 mL/min (ref >60)
GFR calc non Af Amer: 54 mL/min — ABNORMAL LOW (ref >60)
Glucose, Bld: 138 mg/dL — ABNORMAL HIGH (ref 70–99)
Potassium: 4.3 mmol/L (ref 3.5–5.1)
Sodium: 142 mmol/L (ref 135–145)
Total Bilirubin: 0.7 mg/dL (ref 0.3–1.2)
Total Protein: 7.6 g/dL (ref 6.5–8.1)

## 2019-06-21 LAB — URINALYSIS, ROUTINE W REFLEX MICROSCOPIC
Bacteria, UA: NONE SEEN
Bilirubin Urine: NEGATIVE
Glucose, UA: NEGATIVE mg/dL
Hgb urine dipstick: NEGATIVE
Ketones, ur: NEGATIVE mg/dL
Nitrite: NEGATIVE
Protein, ur: NEGATIVE mg/dL
Specific Gravity, Urine: 1.019 (ref 1.005–1.030)
pH: 5 (ref 5.0–8.0)

## 2019-06-21 LAB — SURGICAL PCR SCREEN
MRSA, PCR: NEGATIVE
Staphylococcus aureus: POSITIVE — AB

## 2019-06-21 LAB — PROTIME-INR
INR: 1.1 (ref 0.8–1.2)
Prothrombin Time: 13.6 seconds (ref 11.4–15.2)

## 2019-06-21 LAB — ABO/RH: ABO/RH(D): O POS

## 2019-06-21 LAB — APTT: aPTT: 27 seconds (ref 24–36)

## 2019-06-21 NOTE — Progress Notes (Signed)
PCP - Dr. Joylene Grapes. LOV: 06/15/19 Cardiologist - Dr. Dina Rich. : Clearance: 05/26/19. : chart  Chest x-ray - 06/10/19 EKG - 11/30/18 Stress Test -  ECHO -  Cardiac Cath -   Sleep Study -  CPAP -   Fasting Blood Sugar -  Checks Blood Sugar _____ times a day  Blood Thinner Instructions: Aspirin Instructions: Last Dose:  Anesthesia review:   Patient denies shortness of breath, fever, cough and chest pain at PAT appointment   Patient verbalized understanding of instructions that were given to them at the PAT appointment. Patient was also instructed that they will need to review over the PAT instructions again at home before surgery.

## 2019-06-21 NOTE — Progress Notes (Signed)
PCR: positive for STAPH 

## 2019-06-21 NOTE — Telephone Encounter (Signed)
Contacted about a heart murmur noted on exam during preop evaulation. We had last examined patient 11/2018 without a reported murmur. With upcomng surgery plan for echo to furtehr evaluate, will work to get this week   Dominga Ferry MD

## 2019-06-22 LAB — URINE CULTURE
Culture: <10000 — AB
Special Requests: NORMAL

## 2019-06-25 ENCOUNTER — Other Ambulatory Visit: Payer: Self-pay

## 2019-06-25 ENCOUNTER — Other Ambulatory Visit (HOSPITAL_COMMUNITY)
Admission: RE | Admit: 2019-06-25 | Discharge: 2019-06-25 | Disposition: A | Discharge status: Home / Self Care | Payer: 59 | Source: Ambulatory Visit | Attending: Orthopaedic Surgery | Admitting: Orthopaedic Surgery

## 2019-06-25 DIAGNOSIS — Z01812 Encounter for preprocedural laboratory examination: Secondary | ICD-10-CM | POA: Insufficient documentation

## 2019-06-25 DIAGNOSIS — Z20822 Contact with and (suspected) exposure to covid-19: Secondary | ICD-10-CM | POA: Diagnosis not present

## 2019-06-26 LAB — SARS CORONAVIRUS 2 (TAT 6-24 HRS): SARS Coronavirus 2: NEGATIVE

## 2019-06-28 NOTE — Anesthesia Preprocedure Evaluation (Addendum)
Anesthesia Evaluation  Patient identified by MRN, date of birth, ID band Patient awake    Reviewed: Allergy & Precautions, NPO status , Patient's Chart, lab work & pertinent test results  History of Anesthesia Complications Negative for: history of anesthetic complications  Airway Mallampati: I  TM Distance: >3 FB Neck ROM: Full    Dental  (+) Dental Advisory Given, Partial Upper   Pulmonary neg pulmonary ROS,    Pulmonary exam normal breath sounds clear to auscultation       Cardiovascular hypertension, Pt. on medications (-) angina(-) CAD and (-) Past MI Normal cardiovascular exam Rhythm:Regular Rate:Normal     Neuro/Psych negative neurological ROS  negative psych ROS   GI/Hepatic negative GI ROS, Neg liver ROS,   Endo/Other  negative endocrine ROS  Renal/GU negative Renal ROS     Musculoskeletal  (+) Arthritis , Osteoarthritis,  right hip osteoarthritis   Abdominal   Peds  Hematology negative hematology ROS (+) Plt 245k   Anesthesia Other Findings   Reproductive/Obstetrics                            Anesthesia Physical Anesthesia Plan  ASA: II  Anesthesia Plan: Spinal   Post-op Pain Management:    Induction: Intravenous  PONV Risk Score and Plan: 2 and Propofol infusion, Treatment may vary due to age or medical condition and Midazolam  Airway Management Planned: Natural Airway and Nasal Cannula  Additional Equipment:   Intra-op Plan:   Post-operative Plan:   Informed Consent: I have reviewed the patients History and Physical, chart, labs and discussed the procedure including the risks, benefits and alternatives for the proposed anesthesia with the patient or authorized representative who has indicated his/her understanding and acceptance.     Dental advisory given  Plan Discussed with: CRNA  Anesthesia Plan Comments:        Anesthesia Quick Evaluation

## 2019-06-29 ENCOUNTER — Ambulatory Visit (HOSPITAL_COMMUNITY): Payer: 59 | Admitting: Certified Registered Nurse Anesthetist

## 2019-06-29 ENCOUNTER — Ambulatory Visit (HOSPITAL_COMMUNITY)
Admission: RE | Admit: 2019-06-29 | Discharge: 2019-06-30 | Disposition: A | Discharge status: Home / Self Care | Payer: 59 | Attending: Orthopaedic Surgery | Admitting: Orthopaedic Surgery

## 2019-06-29 ENCOUNTER — Other Ambulatory Visit: Payer: Self-pay

## 2019-06-29 ENCOUNTER — Ambulatory Visit (HOSPITAL_COMMUNITY): Payer: 59

## 2019-06-29 ENCOUNTER — Encounter (HOSPITAL_COMMUNITY): Payer: Self-pay | Admitting: Orthopaedic Surgery

## 2019-06-29 ENCOUNTER — Ambulatory Visit (HOSPITAL_COMMUNITY): Payer: 59 | Admitting: Physician Assistant

## 2019-06-29 ENCOUNTER — Encounter (HOSPITAL_COMMUNITY)
Admission: RE | Disposition: A | Discharge status: Home / Self Care | Payer: Self-pay | Source: Home / Self Care | Attending: Orthopaedic Surgery

## 2019-06-29 DIAGNOSIS — M1611 Unilateral primary osteoarthritis, right hip: Secondary | ICD-10-CM | POA: Insufficient documentation

## 2019-06-29 DIAGNOSIS — Z79899 Other long term (current) drug therapy: Secondary | ICD-10-CM | POA: Insufficient documentation

## 2019-06-29 DIAGNOSIS — Z96641 Presence of right artificial hip joint: Secondary | ICD-10-CM

## 2019-06-29 DIAGNOSIS — M25851 Other specified joint disorders, right hip: Secondary | ICD-10-CM | POA: Insufficient documentation

## 2019-06-29 DIAGNOSIS — M2570 Osteophyte, unspecified joint: Secondary | ICD-10-CM | POA: Diagnosis not present

## 2019-06-29 DIAGNOSIS — Z7982 Long term (current) use of aspirin: Secondary | ICD-10-CM | POA: Insufficient documentation

## 2019-06-29 DIAGNOSIS — I10 Essential (primary) hypertension: Secondary | ICD-10-CM | POA: Diagnosis not present

## 2019-06-29 DIAGNOSIS — I700 Atherosclerosis of aorta: Secondary | ICD-10-CM | POA: Diagnosis not present

## 2019-06-29 DIAGNOSIS — R2689 Other abnormalities of gait and mobility: Secondary | ICD-10-CM | POA: Diagnosis not present

## 2019-06-29 DIAGNOSIS — M8568 Other cyst of bone, other site: Secondary | ICD-10-CM | POA: Diagnosis not present

## 2019-06-29 DIAGNOSIS — Z9889 Other specified postprocedural states: Secondary | ICD-10-CM

## 2019-06-29 HISTORY — PX: TOTAL HIP ARTHROPLASTY: SHX124

## 2019-06-29 LAB — TYPE AND SCREEN
ABO/RH(D): O POS
Antibody Screen: NEGATIVE

## 2019-06-29 SURGERY — ARTHROPLASTY, HIP, TOTAL,POSTERIOR APPROACH
Anesthesia: Spinal | Site: Hip | Laterality: Right

## 2019-06-29 MED ORDER — ACETAMINOPHEN 500 MG PO TABS
1000.0000 mg | ORAL_TABLET | Freq: Once | ORAL | Status: AC
  Administered 2019-06-29: 1000 mg via ORAL
  Filled 2019-06-29: qty 2

## 2019-06-29 MED ORDER — BISACODYL 10 MG RE SUPP: 10.0000 mg | Freq: Every day | RECTAL | Status: DC | PRN

## 2019-06-29 MED ORDER — PROPOFOL 1000 MG/100ML IV EMUL
INTRAVENOUS | Status: AC
  Filled 2019-06-29: qty 100

## 2019-06-29 MED ORDER — OXYCODONE HCL 5 MG PO TABS
ORAL_TABLET | ORAL | Status: AC
  Administered 2019-06-29: 5 mg
  Filled 2019-06-29: qty 1

## 2019-06-29 MED ORDER — ALBUMIN HUMAN 5 % IV SOLN
INTRAVENOUS | Status: AC
  Filled 2019-06-29: qty 250

## 2019-06-29 MED ORDER — POVIDONE-IODINE 10 % EX SWAB
2.0000 "application " | Freq: Once | CUTANEOUS | Status: AC
  Administered 2019-06-29: 2 via TOPICAL

## 2019-06-29 MED ORDER — PHENYLEPHRINE HCL-NACL 10-0.9 MG/250ML-% IV SOLN
INTRAVENOUS | Status: DC | PRN
  Administered 2019-06-29: 35 ug/min via INTRAVENOUS

## 2019-06-29 MED ORDER — METOCLOPRAMIDE HCL 5 MG PO TABS: 5.0000 mg | ORAL_TABLET | Freq: Three times a day (TID) | ORAL | Status: DC | PRN

## 2019-06-29 MED ORDER — ONDANSETRON HCL 4 MG/2ML IJ SOLN
INTRAMUSCULAR | Status: AC
  Filled 2019-06-29: qty 2

## 2019-06-29 MED ORDER — POLYETHYLENE GLYCOL 3350 17 G PO PACK: 17.0000 g | PACK | Freq: Every day | ORAL | Status: DC | PRN

## 2019-06-29 MED ORDER — KETOROLAC TROMETHAMINE 15 MG/ML IJ SOLN
7.5000 mg | Freq: Four times a day (QID) | INTRAMUSCULAR | Status: AC
  Administered 2019-06-29 – 2019-06-30 (×4): 7.5 mg via INTRAVENOUS
  Filled 2019-06-29 (×4): qty 1

## 2019-06-29 MED ORDER — METHOCARBAMOL 500 MG IVPB - SIMPLE MED
500.0000 mg | Freq: Four times a day (QID) | INTRAVENOUS | Status: DC | PRN
  Filled 2019-06-29: qty 50

## 2019-06-29 MED ORDER — FENTANYL CITRATE (PF) 100 MCG/2ML IJ SOLN
INTRAMUSCULAR | Status: DC | PRN
  Administered 2019-06-29: 50 ug via INTRAVENOUS

## 2019-06-29 MED ORDER — ONDANSETRON HCL 4 MG PO TABS: 4.0000 mg | ORAL_TABLET | Freq: Four times a day (QID) | ORAL | Status: DC | PRN

## 2019-06-29 MED ORDER — SODIUM CHLORIDE 0.9 % IR SOLN
Status: DC | PRN
  Administered 2019-06-29: 1000 mL

## 2019-06-29 MED ORDER — CEFAZOLIN SODIUM-DEXTROSE 2-4 GM/100ML-% IV SOLN
2.0000 g | Freq: Four times a day (QID) | INTRAVENOUS | Status: AC
  Administered 2019-06-29 (×2): 2 g via INTRAVENOUS
  Filled 2019-06-29 (×2): qty 100

## 2019-06-29 MED ORDER — FENTANYL CITRATE (PF) 100 MCG/2ML IJ SOLN
INTRAMUSCULAR | Status: AC
  Filled 2019-06-29: qty 2

## 2019-06-29 MED ORDER — OXYCODONE HCL 5 MG PO TABS
5.0000 mg | ORAL_TABLET | ORAL | Status: DC | PRN
  Administered 2019-06-29 (×2): 10 mg via ORAL
  Administered 2019-06-29 (×2): 5 mg via ORAL
  Administered 2019-06-30 (×2): 10 mg via ORAL
  Filled 2019-06-29: qty 1
  Filled 2019-06-29 (×4): qty 2

## 2019-06-29 MED ORDER — ONDANSETRON HCL 4 MG/2ML IJ SOLN: 4.0000 mg | Freq: Four times a day (QID) | INTRAMUSCULAR | Status: DC | PRN

## 2019-06-29 MED ORDER — PHENOL 1.4 % MT LIQD: 1.0000 | OROMUCOSAL | Status: DC | PRN

## 2019-06-29 MED ORDER — MIDAZOLAM HCL 2 MG/2ML IJ SOLN
INTRAMUSCULAR | Status: AC
  Filled 2019-06-29: qty 2

## 2019-06-29 MED ORDER — MENTHOL 3 MG MT LOZG: 1.0000 | LOZENGE | OROMUCOSAL | Status: DC | PRN

## 2019-06-29 MED ORDER — FENTANYL CITRATE (PF) 100 MCG/2ML IJ SOLN
25.0000 ug | INTRAMUSCULAR | Status: DC | PRN
  Administered 2019-06-29 (×2): 50 ug via INTRAVENOUS

## 2019-06-29 MED ORDER — EPHEDRINE SULFATE-NACL 50-0.9 MG/10ML-% IV SOSY
PREFILLED_SYRINGE | INTRAVENOUS | Status: DC | PRN
  Administered 2019-06-29: 10 mg via INTRAVENOUS
  Administered 2019-06-29: 5 mg via INTRAVENOUS
  Administered 2019-06-29: 10 mg via INTRAVENOUS

## 2019-06-29 MED ORDER — ONDANSETRON HCL 4 MG/2ML IJ SOLN
INTRAMUSCULAR | Status: DC | PRN
  Administered 2019-06-29: 4 mg via INTRAVENOUS

## 2019-06-29 MED ORDER — ALUM & MAG HYDROXIDE-SIMETH 200-200-20 MG/5ML PO SUSP: 30.0000 mL | ORAL | Status: DC | PRN

## 2019-06-29 MED ORDER — PROPOFOL 500 MG/50ML IV EMUL
INTRAVENOUS | Status: DC | PRN
  Administered 2019-06-29: 75 ug/kg/min via INTRAVENOUS

## 2019-06-29 MED ORDER — SODIUM CHLORIDE 0.9 % IV SOLN
75.0000 mL/h | INTRAVENOUS | Status: DC
  Administered 2019-06-29 – 2019-06-30 (×2): 75 mL/h via INTRAVENOUS

## 2019-06-29 MED ORDER — MIDAZOLAM HCL 5 MG/5ML IJ SOLN
INTRAMUSCULAR | Status: DC | PRN
  Administered 2019-06-29 (×2): 1 mg via INTRAVENOUS

## 2019-06-29 MED ORDER — PHENYLEPHRINE HCL (PRESSORS) 10 MG/ML IV SOLN
INTRAVENOUS | Status: AC
  Filled 2019-06-29: qty 1

## 2019-06-29 MED ORDER — MAGNESIUM CITRATE PO SOLN: 1.0000 | Freq: Once | ORAL | Status: DC | PRN

## 2019-06-29 MED ORDER — ONDANSETRON HCL 4 MG/2ML IJ SOLN: 4.0000 mg | Freq: Once | INTRAMUSCULAR | Status: DC | PRN

## 2019-06-29 MED ORDER — BUPIVACAINE-EPINEPHRINE 0.25% -1:200000 IJ SOLN
INTRAMUSCULAR | Status: AC
  Filled 2019-06-29: qty 1

## 2019-06-29 MED ORDER — DIPHENHYDRAMINE HCL 12.5 MG/5ML PO ELIX
12.5000 mg | ORAL_SOLUTION | ORAL | Status: DC | PRN
  Administered 2019-06-30: 25 mg via ORAL
  Filled 2019-06-29: qty 10

## 2019-06-29 MED ORDER — FENTANYL CITRATE (PF) 100 MCG/2ML IJ SOLN
INTRAMUSCULAR | Status: AC
  Filled 2019-06-29: qty 4

## 2019-06-29 MED ORDER — STERILE WATER FOR IRRIGATION IR SOLN
Status: DC | PRN
  Administered 2019-06-29: 2000 mL

## 2019-06-29 MED ORDER — METOCLOPRAMIDE HCL 5 MG/ML IJ SOLN: 5.0000 mg | Freq: Three times a day (TID) | INTRAMUSCULAR | Status: DC | PRN

## 2019-06-29 MED ORDER — EPHEDRINE 5 MG/ML INJ
INTRAVENOUS | Status: AC
  Filled 2019-06-29: qty 10

## 2019-06-29 MED ORDER — METHOCARBAMOL 500 MG PO TABS
500.0000 mg | ORAL_TABLET | Freq: Four times a day (QID) | ORAL | Status: DC | PRN
  Administered 2019-06-29 – 2019-06-30 (×3): 500 mg via ORAL
  Filled 2019-06-29 (×3): qty 1

## 2019-06-29 MED ORDER — PROPOFOL 10 MG/ML IV BOLUS
INTRAVENOUS | Status: DC | PRN
  Administered 2019-06-29: 20 mg via INTRAVENOUS
  Administered 2019-06-29: 10 mg via INTRAVENOUS

## 2019-06-29 MED ORDER — ALBUMIN HUMAN 5 % IV SOLN
12.5000 g | Freq: Once | INTRAVENOUS | Status: AC
  Administered 2019-06-29: 12.5 g via INTRAVENOUS

## 2019-06-29 MED ORDER — BUPIVACAINE IN DEXTROSE 0.75-8.25 % IT SOLN
INTRATHECAL | Status: DC | PRN
  Administered 2019-06-29: 2 mL via INTRATHECAL

## 2019-06-29 MED ORDER — HYDROMORPHONE HCL 1 MG/ML IJ SOLN
0.5000 mg | INTRAMUSCULAR | Status: DC | PRN
  Administered 2019-06-29: 1 mg via INTRAVENOUS
  Administered 2019-06-30: 0.5 mg via INTRAVENOUS
  Filled 2019-06-29 (×2): qty 1

## 2019-06-29 MED ORDER — LACTATED RINGERS IV SOLN: INTRAVENOUS | Status: DC

## 2019-06-29 MED ORDER — ACETAMINOPHEN 325 MG PO TABS
325.0000 mg | ORAL_TABLET | Freq: Four times a day (QID) | ORAL | Status: DC | PRN
  Administered 2019-06-29: 650 mg via ORAL
  Filled 2019-06-29: qty 2

## 2019-06-29 MED ORDER — BUPIVACAINE-EPINEPHRINE 0.5% -1:200000 IJ SOLN
INTRAMUSCULAR | Status: DC | PRN
  Administered 2019-06-29: 20 mL

## 2019-06-29 MED ORDER — DOCUSATE SODIUM 100 MG PO CAPS
100.0000 mg | ORAL_CAPSULE | Freq: Two times a day (BID) | ORAL | Status: DC
  Administered 2019-06-29 – 2019-06-30 (×3): 100 mg via ORAL
  Filled 2019-06-29 (×3): qty 1

## 2019-06-29 MED ORDER — ASPIRIN 81 MG PO CHEW
81.0000 mg | CHEWABLE_TABLET | Freq: Two times a day (BID) | ORAL | Status: DC
  Administered 2019-06-29 – 2019-06-30 (×2): 81 mg via ORAL
  Filled 2019-06-29 (×2): qty 1

## 2019-06-29 MED ORDER — SODIUM CHLORIDE 0.9 % IV SOLN: INTRAVENOUS | Status: DC

## 2019-06-29 MED ORDER — CEFAZOLIN SODIUM-DEXTROSE 2-4 GM/100ML-% IV SOLN
2.0000 g | INTRAVENOUS | Status: AC
  Administered 2019-06-29: 2 g via INTRAVENOUS
  Filled 2019-06-29: qty 100

## 2019-06-29 MED ORDER — TRANEXAMIC ACID-NACL 1000-0.7 MG/100ML-% IV SOLN
1000.0000 mg | INTRAVENOUS | Status: AC
  Administered 2019-06-29: 1000 mg via INTRAVENOUS
  Filled 2019-06-29: qty 100

## 2019-06-29 SURGICAL SUPPLY — 52 items
ARTICULEZE HEAD (Hips) ×3 IMPLANT
BAG SPEC THK2 15X12 ZIP CLS (MISCELLANEOUS) ×1
BAG ZIPLOCK 12X15 (MISCELLANEOUS) ×3 IMPLANT
BLADE SAW SAG 73X25 THK (BLADE) ×1
BLADE SAW SGTL 73X25 THK (BLADE) ×2 IMPLANT
COVER SURGICAL LIGHT HANDLE (MISCELLANEOUS) ×3 IMPLANT
COVER WAND RF STERILE (DRAPES) IMPLANT
DRAPE ORTHO SPLIT 77X108 STRL (DRAPES) ×6
DRAPE POUCH INSTRU U-SHP 10X18 (DRAPES) ×3 IMPLANT
DRAPE SHEET LG 3/4 BI-LAMINATE (DRAPES) ×5 IMPLANT
DRAPE SURG ORHT 6 SPLT 77X108 (DRAPES) ×2 IMPLANT
DRSG MEPILEX BORDER 4X8 (GAUZE/BANDAGES/DRESSINGS) ×3 IMPLANT
DURAPREP 26ML APPLICATOR (WOUND CARE) ×6 IMPLANT
ELECT REM PT RETURN 15FT ADLT (MISCELLANEOUS) ×3 IMPLANT
ELIMINATOR HOLE APEX DEPUY (Hips) ×2 IMPLANT
FACESHIELD WRAPAROUND (MASK) ×12 IMPLANT
FACESHIELD WRAPAROUND OR TEAM (MASK) ×4 IMPLANT
GLOVE BIOGEL PI IND STRL 8 (GLOVE) ×1 IMPLANT
GLOVE BIOGEL PI IND STRL 8.5 (GLOVE) ×1 IMPLANT
GLOVE BIOGEL PI INDICATOR 8 (GLOVE) ×2
GLOVE BIOGEL PI INDICATOR 8.5 (GLOVE) ×2
GLOVE ECLIPSE 8.0 STRL XLNG CF (GLOVE) ×6 IMPLANT
GLOVE ECLIPSE 8.5 STRL (GLOVE) ×6 IMPLANT
GOWN STRL REUS W/TWL 2XL LVL3 (GOWN DISPOSABLE) ×3 IMPLANT
GOWN STRL REUS W/TWL LRG LVL3 (GOWN DISPOSABLE) ×3 IMPLANT
HEAD ARTICULEZE (Hips) IMPLANT
KIT BASIN (CUSTOM PROCEDURE TRAY) ×3 IMPLANT
KIT TURNOVER KIT A (KITS) ×2 IMPLANT
LINER NEUTRAL 52X36X52 PLUS 4 (Liner) ×2 IMPLANT
MANIFOLD NEPTUNE II (INSTRUMENTS) ×3 IMPLANT
NDL SAFETY ECLIPSE 18X1.5 (NEEDLE) ×1 IMPLANT
NEEDLE HYPO 18GX1.5 SHARP (NEEDLE) ×3
NS IRRIG 1000ML POUR BTL (IV SOLUTION) ×3 IMPLANT
PACK TOTAL JOINT (CUSTOM PROCEDURE TRAY) ×3 IMPLANT
PENCIL SMOKE EVACUATOR (MISCELLANEOUS) ×2 IMPLANT
PIN SECTOR W/GRIP ACE CUP 52MM (Hips) ×2 IMPLANT
PROTECTOR NERVE ULNAR (MISCELLANEOUS) ×3 IMPLANT
STAPLER VISISTAT 35W (STAPLE) ×3 IMPLANT
STEM FEM CMNTLSS SM AML 15.0 (Hips) ×2 IMPLANT
SUT ETHIBOND NAB CT1 #1 30IN (SUTURE) ×6 IMPLANT
SUT MNCRL AB 3-0 PS2 18 (SUTURE) ×3 IMPLANT
SUT VIC AB 0 CT1 27 (SUTURE) ×3
SUT VIC AB 0 CT1 27XBRD ANTBC (SUTURE) ×1 IMPLANT
SUT VIC AB 1 CTX 36 (SUTURE) ×6
SUT VIC AB 1 CTX36XBRD ANBCTR (SUTURE) ×2 IMPLANT
SUT VIC AB 2-0 CT1 27 (SUTURE) ×6
SUT VIC AB 2-0 CT1 TAPERPNT 27 (SUTURE) ×2 IMPLANT
SYR CONTROL 10ML LL (SYRINGE) ×3 IMPLANT
TOWEL OR 17X26 10 PK STRL BLUE (TOWEL DISPOSABLE) ×3 IMPLANT
TOWEL OR NON WOVEN STRL DISP B (DISPOSABLE) ×3 IMPLANT
TRAY FOLEY MTR SLVR 16FR STAT (SET/KITS/TRAYS/PACK) ×3 IMPLANT
WATER STERILE IRR 1000ML POUR (IV SOLUTION) ×6 IMPLANT

## 2019-06-29 NOTE — Op Note (Signed)
The recent History & Physical has been reviewed. I have personally examined the patient today. There is no interval change to the documented History & Physical. The patient would like to proceed with the procedure.  [name] 06/29/2019,  9:16 AM

## 2019-06-29 NOTE — Evaluation (Signed)
Physical Therapy Evaluation Patient Details Name: [name] MRN: [id number] DOB: June 02, 1950 Today's Date: 06/29/2019   History of Present Illness  R posterior THA on 06/29/19  Clinical Impression  Pt is s/p THA resulting in the deficits listed below (see PT Problem List). Pt ambulated 50' with RW, no loss of balance. Instructed pt in posterior THA precautions. Good progress expected.  Pt will benefit from skilled PT to increase their independence and safety with mobility to allow discharge to the venue listed below.      Follow Up Recommendations Follow surgeon's recommendation for DC plan and follow-up therapies    Equipment Recommendations  Rolling walker with 5" wheels;3in1 (PT)    Recommendations for Other Services       Precautions / Restrictions Precautions Precautions: Posterior Hip Precaution Booklet Issued: Yes (comment) Precaution Comments: instructed pt in posterior precautions, sign in room Restrictions Weight Bearing Restrictions: No Other Position/Activity Restrictions: WBAT      Mobility  Bed Mobility Overal bed mobility: Needs Assistance Bed Mobility: Supine to Sit     Supine to sit: Min assist;HOB elevated     General bed mobility comments: min A to raise trunk  Transfers Overall transfer level: Needs assistance Equipment used: Rolling walker (2 wheeled) Transfers: Sit to/from Stand Sit to Stand: Min assist;From elevated surface         General transfer comment: VCs hand placement  Ambulation/Gait Ambulation/Gait assistance: Min guard Gait Distance (Feet): 50 Feet Assistive device: Rolling walker (2 wheeled) Gait Pattern/deviations: Step-to pattern;Decreased stride length;Decreased stance time - right Gait velocity: decr   General Gait Details: VCs sequencing, no loss of balance  Stairs            Wheelchair Mobility    Modified Rankin (Stroke Patients Only)       Balance Overall balance assessment: Modified Independent                                            Pertinent Vitals/Pain Pain Assessment: 0-10 Pain Score: 5  Pain Location: R hip Pain Descriptors / Indicators: Sore Pain Intervention(s): Limited activity within patient's tolerance;Monitored during session;Premedicated before session;Ice applied    Home Living Family/patient expects to be discharged to:: Private residence Living Arrangements: Spouse/significant other Available Help at Discharge: Family;Available 24 hours/day Type of Home: House Home Access: Stairs to enter   [employer] of Steps: 1 Home Layout: One level Home Equipment: None      Prior Function Level of Independence: Independent         Comments: DC to daughter's home     Hand Dominance        Extremity/Trunk Assessment   Upper Extremity Assessment Upper Extremity Assessment: Overall WFL for tasks assessed    Lower Extremity Assessment Lower Extremity Assessment: RLE deficits/detail RLE Deficits / Details: R hip AAROM WFL, knee ext at least 3/5 RLE Sensation: WNL RLE Coordination: WNL    Cervical / Trunk Assessment Cervical / Trunk Assessment: Normal  Communication   Communication: No difficulties  Cognition Arousal/Alertness: Awake/alert Behavior During Therapy: WFL for tasks assessed/performed                                          General Comments      Exercises Total Joint Exercises Ankle  Circles/Pumps: AROM;Both;10 reps;Supine   Assessment/Plan    PT Assessment Patient needs continued PT services  PT Problem List Decreased strength;Decreased mobility;Decreased activity tolerance;Pain       PT Treatment Interventions DME instruction;Gait training;Stair training;Functional mobility training;Therapeutic exercise;Patient/family education;Therapeutic activities    PT Goals (Current goals can be found in the Care Plan section)  Acute Rehab PT Goals Patient 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, community carrier, go fishing PT Goal Formulation: With patient Time For Goal Achievement: 07/06/19 Potential to Achieve Goals: Good    Frequency 7X/week   Barriers to discharge        Co-evaluation               AM-PAC PT "6 Clicks" Mobility  Outcome Measure Help needed turning from your back to your side while in a flat bed without using bedrails?: A Little Help needed moving from lying on your back to sitting on the side of a flat bed without using bedrails?: A Little Help needed moving to and from a bed to a chair (including a wheelchair)?: A Little Help needed standing up from a chair using your arms (e.g., wheelchair or bedside chair)?: A Little Help needed to walk in hospital room?: A Little Help needed climbing 3-5 steps with a railing? : A Lot 6 Click Score: 17    End of Session Equipment Utilized During Treatment: Gait belt Activity Tolerance: Patient tolerated treatment well Patient left: in chair;with call bell/phone within reach;with chair alarm set Nurse Communication: Mobility status PT Visit Diagnosis: Difficulty in walking, not elsewhere classified (R26.2);Pain Pain - Right/Left: Right Pain - part of body: Hip    Time: 1308-6578 PT Time Calculation (min) (ACUTE ONLY): 34 min   Charges:   PT Evaluation $PT Eval Low Complexity: 1 Low PT Treatments $Gait Training: 8-22 mins        Blondell Reveal Kistler PT 06/29/2019  Acute Rehabilitation Services Pager [phone] Office [phone]

## 2019-06-29 NOTE — Transfer of Care (Signed)
Immediate Anesthesia Transfer of Care Note  Patient: [name]  Procedure(s) Performed: RIGHT TOTAL HIP ARTHROPLASTY (Right Hip)  Patient Location: PACU  Anesthesia Type:MAC and Spinal  Level of Consciousness: awake, alert  and oriented  Airway & Oxygen Therapy: Patient Spontanous Breathing and Patient connected to face mask oxygen  Post-op Assessment: Report given to RN and Post -op Vital signs reviewed and stable  Post vital signs: Reviewed and stable  Last Vitals:  Vitals Value Taken Time  BP 93/57 06/29/19 0945  Temp    Pulse 54 06/29/19 0947  Resp 17 06/29/19 0947  SpO2 87 % 06/29/19 0947  Vitals shown include unvalidated device data.  Last Pain:  Vitals:   06/29/19 0550  TempSrc:   PainSc: 0-No pain      Patients Stated Pain Goal: 4 (03/29/34 1224)  Complications: No apparent anesthesia complications

## 2019-06-29 NOTE — Anesthesia Postprocedure Evaluation (Signed)
Anesthesia Post Note  Patient: [name]  Procedure(s) Performed: RIGHT TOTAL HIP ARTHROPLASTY (Right Hip)     Patient location during evaluation: PACU Anesthesia Type: Spinal Level of consciousness: oriented, awake and alert and awake Pain management: pain level controlled Vital Signs Assessment: post-procedure vital signs reviewed and stable Respiratory status: spontaneous breathing, respiratory function stable, patient connected to nasal cannula oxygen and nonlabored ventilation Cardiovascular status: blood pressure returned to baseline and stable Postop Assessment: no headache, no backache, no apparent nausea or vomiting, patient able to bend at knees and spinal receding Anesthetic complications: no    Last Vitals:  Vitals:   06/29/19 1130 06/29/19 1145  BP: 102/64 108/70  Pulse: (!) 51 (!) 48  Resp: 12 15  Temp:  36.4 C  SpO2: 96% 100%    Last Pain:  Vitals:   06/29/19 1145  TempSrc: Axillary  PainSc: 5                  Cecile Hearing

## 2019-06-29 NOTE — Brief Op Note (Signed)
06/29/2019  7:40 AM  PATIENT:  [name]  69 y.o. male  PRE-OPERATIVE DIAGNOSIS:  end stage right hip osteoarthritis  POST-OPERATIVE DIAGNOSIS:  same  PROCEDURE:  right total hip replacement  SURGEON:  Surgeon(s) and Role:    * Valeria Batman, MD - Primary  PHYSICIAN ASSISTANT: Silvestre Moment   ANESTHESIA:   spinal and IV sedation  EBL:   BLOOD ADMINISTERED:none  DRAINS: none   LOCAL MEDICATIONS USED: none  SPECIMEN:  No Specimen  DISPOSITION OF SPECIMEN:  N/A  COUNTS:  YES  TOURNIQUET:  * No tourniquets in log *  DICTATION: .Dragon Dictation  PLAN OF CARE: Admit for overnight observation  PATIENT DISPOSITION:  PACU - hemodynamically stable.   Delay start of Pharmacological VTE agent (>24hrs) due to surgical blood loss or risk of bleeding: not applicable

## 2019-06-29 NOTE — Anesthesia Procedure Notes (Signed)
Procedure Name: MAC Date/Time: 06/29/2019 7:21 AM Performed by: Maxwell Caul, CRNA Pre-anesthesia Checklist: Patient identified, Emergency Drugs available, Suction available and Patient being monitored Oxygen Delivery Method: Simple face mask

## 2019-06-29 NOTE — Anesthesia Procedure Notes (Signed)
Spinal  Patient location during procedure: OR Start time: 06/29/2019 7:24 AM End time: 06/29/2019 7:29 AM Staffing Performed: anesthesiologist  Anesthesiologist: Cecile Hearing, MD Preanesthetic Checklist Completed: patient identified, IV checked, risks and benefits discussed, surgical consent, monitors and equipment checked, pre-op evaluation and timeout performed Spinal Block Patient position: sitting Prep: DuraPrep and site prepped and draped Patient monitoring: continuous pulse ox and blood pressure Approach: midline Location: L3-4 Injection technique: single-shot Needle Needle type: Pencan  Needle gauge: 24 G Assessment Sensory level: T6 Additional Notes Functioning IV was confirmed and monitors were applied. Sterile prep and drape, including hand hygiene, mask and sterile gloves were used. The patient was positioned and the spine was prepped. The skin was anesthetized with lidocaine.  Free flow of clear CSF was obtained prior to injecting local anesthetic into the CSF.  The spinal needle aspirated freely following injection.  The needle was carefully withdrawn.  The patient tolerated the procedure well. Consent was obtained prior to procedure with all questions answered and concerns addressed. Risks including but not limited to bleeding, infection, nerve damage, paralysis, failed block, inadequate analgesia, allergic reaction, high spinal, itching and headache were discussed and the patient wished to proceed.   Attempt x1 by CRNA, Attempt x1 by MDA with CSF return.  Arrie Aran, MD

## 2019-06-29 NOTE — Op Note (Signed)
PATIENT ID:      [name]  MRN:     [id number] DOB/AGE:    November 18, 1950 / 69 y.o.       OPERATIVE REPORT    DATE OF PROCEDURE:  06/29/2019       PREOPERATIVE DIAGNOSIS: end stage  right hip osteoarthritis                                                       Estimated body mass index is 27.25 kg/m as calculated from the following:   Height as of this encounter: 5\' 11"  (1.803 m).   Weight as of this encounter: 88.6 kg.     POSTOPERATIVE DIAGNOSIS:end stage  right hip osteoarthritis                                                                     Estimated body mass index is 27.25 kg/m as calculated from the following:   Height as of this encounter: 5\' 11"  (1.803 m).   Weight as of this encounter: 88.6 kg.     PROCEDURE:  Procedure(s): RIGHT TOTAL HIP ARTHROPLASTY      SURGEON:  , MD    ASSISTANT:   , PA-C   (Present and scrubbed throughout the case, critical for assistance with exposure, retraction, instrumentation, and closure.)          ANESTHESIA: spinal and IV sedation     DRAINS: none :      TOURNIQUET TIME: * No tourniquets in log *    COMPLICATIONS:  None   CONDITION:  stable  PROCEDURE IN DETAIL: [name]   [name] 06/29/2019, 9:30 AM

## 2019-06-29 NOTE — Progress Notes (Signed)
Orthopedic Tech Progress Note Patient Details:  [name] 1950-06-11 [id number]  Ortho Devices Ortho Device/Splint Location: Trapeze bar Ortho Device/Splint Interventions: Application   Post Interventions Patient Tolerated: Well Instructions Provided: Care of device, Adjustment of device   Saul Fordyce 06/29/2019, 12:52 PM

## 2019-06-30 ENCOUNTER — Encounter: Payer: Self-pay | Admitting: *Deleted

## 2019-06-30 DIAGNOSIS — M1611 Unilateral primary osteoarthritis, right hip: Secondary | ICD-10-CM | POA: Diagnosis not present

## 2019-06-30 LAB — BASIC METABOLIC PANEL
Anion gap: 3 — ABNORMAL LOW (ref 5–15)
BUN: 16 mg/dL (ref 8–23)
CO2: 28 mmol/L (ref 22–32)
Calcium: 8.1 mg/dL — ABNORMAL LOW (ref 8.9–10.3)
Chloride: 105 mmol/L (ref 98–111)
Creatinine, Ser: 1.06 mg/dL (ref 0.61–1.24)
GFR calc Af Amer: >60 mL/min (ref >60)
GFR calc non Af Amer: >60 mL/min (ref >60)
Glucose, Bld: 118 mg/dL — ABNORMAL HIGH (ref 70–99)
Potassium: 3.5 mmol/L (ref 3.5–5.1)
Sodium: 136 mmol/L (ref 135–145)

## 2019-06-30 LAB — CBC
HCT: 33.6 % — ABNORMAL LOW (ref 39.0–52.0)
Hemoglobin: 10.9 g/dL — ABNORMAL LOW (ref 13.0–17.0)
MCH: 30.3 pg (ref 26.0–34.0)
MCHC: 32.4 g/dL (ref 30.0–36.0)
MCV: 93.3 fL (ref 80.0–100.0)
Platelets: 149 10*3/uL — ABNORMAL LOW (ref 150–400)
RBC: 3.6 MIL/uL — ABNORMAL LOW (ref 4.22–5.81)
RDW: 12.4 % (ref 11.5–15.5)
WBC: 8.4 10*3/uL (ref 4.0–10.5)
nRBC: 0 % (ref 0.0–0.2)

## 2019-06-30 MED ORDER — METHOCARBAMOL 500 MG PO TABS: 500.0000 mg | ORAL_TABLET | Freq: Three times a day (TID) | ORAL | 0 refills | Status: DC | PRN

## 2019-06-30 MED ORDER — OXYCODONE HCL 5 MG PO TABS: 5.0000 mg | ORAL_TABLET | ORAL | 0 refills | Status: DC | PRN

## 2019-06-30 MED ORDER — ACETAMINOPHEN 325 MG PO TABS: 325.0000 mg | ORAL_TABLET | Freq: Four times a day (QID) | ORAL | Status: DC | PRN

## 2019-06-30 MED ORDER — ASPIRIN 81 MG PO CHEW: 81.0000 mg | CHEWABLE_TABLET | Freq: Two times a day (BID) | ORAL | Status: DC

## 2019-06-30 NOTE — Plan of Care (Signed)
  Problem: Education: Goal: Knowledge of the prescribed therapeutic regimen will improve 06/30/2019 1539 by Iantha Fallen, RN Outcome: Adequate for Discharge 06/30/2019 1106 by Iantha Fallen, RN Outcome: Progressing Goal: Understanding of discharge needs will improve 06/30/2019 1539 by Iantha Fallen, RN Outcome: Adequate for Discharge 06/30/2019 1106 by Iantha Fallen, RN Outcome: Progressing Goal: Individualized Educational Video(s) 06/30/2019 1539 by Iantha Fallen, RN Outcome: Adequate for Discharge 06/30/2019 1106 by Iantha Fallen, RN Outcome: Progressing   Problem: Activity: Goal: Ability to avoid complications of mobility impairment will improve Outcome: Adequate for Discharge Goal: Ability to tolerate increased activity will improve Outcome: Adequate for Discharge   Problem: Clinical Measurements: Goal: Postoperative complications will be avoided or minimized Outcome: Adequate for Discharge   Problem: Pain Management: Goal: Pain level will decrease with appropriate interventions Outcome: Adequate for Discharge   Problem: Skin Integrity: Goal: Will show signs of wound healing Outcome: Adequate for Discharge

## 2019-06-30 NOTE — TOC Transition Note (Signed)
Transition of Care [facility]) - CM/SW Discharge Note   Patient Details  Name: Kawan Valladolid MRN: [id number] Date of Birth: Mar 27, 1950  [facility]) CM/SW Contact:  Lennart Pall, LCSW Phone Number: 06/30/2019, 11:43 AM   Clinical Narrative:   Met briefly with pt to review dc needs.  RW and 3n1 ordered via [place] and [place] prearranged with [facility].  No further TOC needs.    Final next level of care: [place] Barriers to Discharge: Barriers Resolved   Patient Goals and CMS Choice Patient states their goals for this hospitalization and ongoing recovery are:: go home      Discharge Placement                       Discharge Plan and Services                DME Arranged: 3-N-1, Walker rolling DME Agency: [employer] Date DME Agency Contacted: 06/30/19 Time DME Agency Contacted: [phone]   [place]: [facility] ([facility])(pre-arranged via MD office)        Social Determinants of Health ([place]) Interventions     Readmission Risk Interventions No flowsheet data found.

## 2019-06-30 NOTE — Progress Notes (Signed)
Physical Therapy Treatment Patient Details Name: [name] MRN: [id number] DOB: 08/04/1950 Today's Date: 06/30/2019    History of Present Illness R posterior THA on 06/29/19    PT Comments    Pt reports significant increase in R hip pain this morning. Instructed pt in THA HEP, reviewed posterior hip precautions, and pt ambulated 15' with RW (distance limited by pain and nausea). He is not ready to DC home from PT standpoint, will plan a second session for this afternoon.    Follow Up Recommendations  Follow surgeon's recommendation for DC plan and follow-up therapies     Equipment Recommendations  Rolling walker with 5" wheels;3in1 (PT)    Recommendations for Other Services       Precautions / Restrictions Precautions Precautions: Posterior Hip Precaution Booklet Issued: Yes (comment) Precaution Comments: pt recalled 2/3 precautions, reviewed precautions in detail, sign in room Restrictions Weight Bearing Restrictions: No Other Position/Activity Restrictions: WBAT    Mobility  Bed Mobility Overal bed mobility: Needs Assistance Bed Mobility: Supine to Sit     Supine to sit: Min assist;HOB elevated     General bed mobility comments: min A to raise trunk  Transfers Overall transfer level: Needs assistance Equipment used: Rolling walker (2 wheeled) Transfers: Sit to/from Stand Sit to Stand: From elevated surface;Min guard         General transfer comment: VCs hand placement  Ambulation/Gait Ambulation/Gait assistance: Min guard Gait Distance (Feet): 15 Feet Assistive device: Rolling walker (2 wheeled) Gait Pattern/deviations: Step-to pattern;Decreased stride length;Decreased stance time - right Gait velocity: decr   General Gait Details: VCs sequencing, no loss of balance, distance limited by nausea and pain   Stairs             Wheelchair Mobility    Modified Rankin (Stroke Patients Only)       Balance Overall balance assessment: Modified  Independent                                          Cognition Arousal/Alertness: Awake/alert Behavior During Therapy: WFL for tasks assessed/performed Overall Cognitive Status: Within Functional Limits for tasks assessed                                        Exercises Total Joint Exercises Ankle Circles/Pumps: AROM;Both;10 reps;Supine Quad Sets: AROM;Right;5 reps;Supine Short Arc Quad: AROM;Right;5 reps;Supine Heel Slides: AAROM;Right;5 reps;Supine Hip ABduction/ADduction: AAROM;Right;5 reps;Supine    General Comments        Pertinent Vitals/Pain Pain Score: 5  Pain Location: R hip Pain Descriptors / Indicators: Crying;Sore Pain Intervention(s): Limited activity within patient's tolerance;Monitored during session;Premedicated before session;Ice applied    Home Living                      Prior Function            PT Goals (current goals can now be found in the care plan section) Acute Rehab PT Goals Patient Stated 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, go fishing PT Goal Formulation: With patient Time For Goal Achievement: 07/06/19 Potential to Achieve Goals: Good Progress towards PT goals: Progressing toward goals    Frequency    7X/week      PT Plan Current plan remains appropriate    Co-evaluation  AM-PAC PT "6 Clicks" Mobility   Outcome Measure  Help needed turning from your back to your side while in a flat bed without using bedrails?: A Little Help needed moving from lying on your back to sitting on the side of a flat bed without using bedrails?: A Little Help needed moving to and from a bed to a chair (including a wheelchair)?: A Little Help needed standing up from a chair using your arms (e.g., wheelchair or bedside chair)?: A Little Help needed to walk in hospital room?: A Little Help needed climbing 3-5 steps with a railing? : A Lot 6 Click Score: 17    End of Session Equipment  Utilized During Treatment: Gait belt Activity Tolerance: Patient limited by pain;Treatment limited secondary to medical complications (Comment)(nausea) Patient left: in chair;with call bell/phone within reach;with chair alarm set Nurse Communication: Mobility status PT Visit Diagnosis: Difficulty in walking, not elsewhere classified (R26.2);Pain Pain - Right/Left: Right Pain - part of body: Hip     Time: 4431-5400 PT Time Calculation (min) (ACUTE ONLY): 31 min  Charges:  $Gait Training: 8-22 mins $Therapeutic Exercise: 8-22 mins                     Blondell Reveal Kistler PT 06/30/2019  Acute Rehabilitation Services Pager [phone] Office [phone]
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
## 2019-06-30 NOTE — Progress Notes (Signed)
Physical Therapy Treatment Patient Details Name: [name] MRN: [id number] DOB: 11/18/1950 Today's Date: 06/30/2019    History of Present Illness R posterior THA on 06/29/19    PT Comments    Pt reports nausea has resolved. He ambulated 30' with RW, completed stair training, and demonstrates good understanding of HEP. He is ready to DC home from PT standpoint.   Follow Up Recommendations  Follow surgeon's recommendation for DC plan and follow-up therapies     Equipment Recommendations  Rolling walker with 5" wheels;3in1 (PT)    Recommendations for Other Services       Precautions / Restrictions Precautions Precautions: Posterior Hip Precaution Booklet Issued: Yes (comment) Precaution Comments: pt recalled 3/3 precautions, reviewed precautions in detail with family, sign in room Restrictions Weight Bearing Restrictions: No Other Position/Activity Restrictions: WBAT    Mobility  Bed Mobility               General bed mobility comments: up in recliner  Transfers Overall transfer level: Needs assistance Equipment used: Rolling walker (2 wheeled) Transfers: Sit to/from Stand Sit to Stand: From elevated surface;Supervision         General transfer comment: VCs hand placement  Ambulation/Gait Ambulation/Gait assistance: Min guard;Supervision Gait Distance (Feet): 130 Feet Assistive device: Rolling walker (2 wheeled) Gait Pattern/deviations: Step-to pattern;Step-through pattern Gait velocity: decr   General Gait Details: VCs sequencing, no loss of balance   Stairs Stairs: Yes Stairs assistance: Min guard;Min assist Stair Management: No rails;Forwards;Step to pattern;With walker Number of Stairs: 1 General stair comments: 1 eight inch step x 2 trials, pt's wife and daughter present, min A to manage RW, VCs sequencing   Wheelchair Mobility    Modified Rankin (Stroke Patients Only)       Balance Overall balance assessment: Modified Independent                                           Cognition Arousal/Alertness: Awake/alert Behavior During Therapy: WFL for tasks assessed/performed Overall Cognitive Status: Within Functional Limits for tasks assessed                                        Exercises Total Joint Exercises Heel Slides: AAROM;Right;Supine;10 reps Hip ABduction/ADduction: AAROM;Right;Supine;10 reps    General Comments        Pertinent Vitals/Pain Pain Score: 3  Pain Location: R hip Pain Descriptors / Indicators: Crying;Sore Pain Intervention(s): Limited activity within patient's tolerance;Monitored during session;Premedicated before session;Ice applied    Home Living                      Prior Function            PT Goals (current goals can now be found in the care plan section) Acute Rehab PT Goals 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, community carrier, go fishing PT Goal Formulation: With patient Time For Goal Achievement: 07/06/19 Potential to Achieve Goals: Good    Frequency    7X/week      PT Plan Current plan remains appropriate    Co-evaluation              AM-PAC PT "6 Clicks" Mobility   Outcome Measure  Help needed turning from your back to your side while in a flat  bed without using bedrails?: None Help needed moving from lying on your back to sitting on the side of a flat bed without using bedrails?: None Help needed moving to and from a bed to a chair (including a wheelchair)?: None Help needed standing up from a chair using your arms (e.g., wheelchair or bedside chair)?: None Help needed to walk in hospital room?: None Help needed climbing 3-5 steps with a railing? : A Little 6 Click Score: 23    End of Session Equipment Utilized During Treatment: Gait belt Activity Tolerance: Patient tolerated treatment well(nausea) Patient left: in chair;with call bell/phone within reach;with chair alarm set;with family/visitor  present Nurse Communication: Mobility status PT Visit Diagnosis: Difficulty in walking, not elsewhere classified (R26.2);Pain Pain - Right/Left: Right Pain - part of body: Hip     Time: 4643-1427 PT Time Calculation (min) (ACUTE ONLY): 27 min  Charges:  $Gait Training: 8-22 mins $Therapeutic Exercise: 8-22 mins                    Ralene Bathe Kistler PT 06/30/2019  Acute Rehabilitation Services Pager [phone] Office [phone]
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
## 2019-06-30 NOTE — Op Note (Signed)
[name], [name] MEDICAL RECORD [id number] ACCOUNT [id number] DATE OF BIRTH:1950-05-12 FACILITY: [facility] LOCATION: [place], [place]  OPERATIVE REPORT  DATE OF PROCEDURE:  06/29/2019  PREOPERATIVE DIAGNOSIS:  End-stage osteoarthritis, right hip.  POSTOPERATIVE DIAGNOSIS:  End-stage osteoarthritis, right hip.  PROCEDURE:  Right total hip replacement.  SURGEON:  Joni Fears, MD  ASSISTANT:  Biagio Borg, PA-C  ANESTHESIA:  Spinal with supplemental IV sedation.  COMPLICATIONS:  None.  COMPONENTS:  DePuy AML 15 mm small stature femoral stem, a 52 mm outer diameter section 3 metallic acetabular component with apex hole eliminator and a Pinnacle polyethylene acetabular liner +4 with a 10-degree posterior lip.  Femoral head was 36 mm  outer diameter with +5 mm neck length.    Components were press-fit.  DESCRIPTION OF PROCEDURE:  The patient was met in the holding area, identified the right hip as appropriate operative site and marked it accordingly.  Any questions were answered.  The patient was then transported to room 7.  Anesthesia performed a spinal without difficulty.  Foley catheter was inserted.  Urine was clear.  The patient was then placed in the lateral decubitus position with the right side up and secured to the operating room table with the Innomed hip system.  IV sedation was also performed.  The right hip was then prepared with chlorhexidine scrub and then DuraPrep times 2 from the iliac crest to the mid calf.  Sterile draping was performed.  A timeout was called.  A routine Southern incision was utilized and via sharp dissection carried down to subcutaneous tissue.  Gross bleeders were Bovie coagulated.  IT band was identified and incised.  Muscle fibers were then identified and manually split.  Self-retaining  retractors were inserted.  The fatty tissue over the short external rotators was incised with the Bovie.  Short external  rotators were identified.  They were released from their attachment to the posterior aspect of the greater trochanter.  Tendinous  structures were tagged.  The hip capsule was identified and incised along the femoral neck.  Femoral head was exposed and there was very minimal effusion.  I could easily dislocate the hip posteriorly.  There were large areas of complete articular  cartilage loss.  Using the calcar guide, I cut the femoral head at the neck head junction.  Retractors were then placed around the femoral neck.  A starter hole was then made.  Reaming was performed to 14.5 mm.  Rasping was performed sequentially from  12-13.5 to a 15 mm component.  I had an excellent fit on the calcar.  I did make an extra cut of about 3 mm as the neck was long and then re-reamed.  During the entire procedure, I checked to be sure the sciatic nerve was intact and was well out of  harm's way.  Retractors were then placed around the acetabulum.  Labrum was sharply excised.  Reaming was performed sequentially to 51 mm with good bleeding bone.  I trialed the 50 mm component that would completely seat with good rim fit, 52 mm would not seat, but  with good rim fit.  Copious irrigation was performed with saline solution.  The Gription 3 metallic acetabular component was then impacted in place using the external guide.  I thought it had an excellent fit and was perfectly stable.  Supplemental screws were not  necessary.  The trial polyethylene liner was then screwed in place.  We irrigated the femur.  We inserted the 15 mm rasp  followed by 36 mm outer diameter hip balls.  We trialled a +1.5 and then a 5 mm neck length.  We thought we had reestablished leg lengths with the 5 and was perfectly stable.  This was again reduced and  through a full range of motion, was perfectly stable without subluxation or dislocation.  There was no toggling.  Leg lengths appeared to be symmetrical with a 5 mm neck length.  Trial  components were removed.  Again, we copiously irrigated the joint with saline solution.  I checked the acetabulum to be sure it was secured.  I inserted an apex hole eliminator followed by the Pinnacle polyethylene liner +4 with a 10-degree lip.  Again, the wound was irrigated.  The femoral component was then impacted flush on the calcar in about 10-15 degrees of anteversion.  We cleaned the [facility] taper neck and applied the 36 mm outer diameter hip ball with a 5 mm neck length.  We cleaned the  acetabulum and reduced the hip.  Again, through a full range of motion, there was no toggling or subluxation.  The wound was again irrigated with saline solution.  The capsule was closed anatomically with a running #1 Ethibond.  Short external rotators were approximated with the same material.  The wound was again irrigated.  The IT band was closed with a running #1 Vicryl.  Subcutaneous was closed with Vicryl,  Monocryl and the skin was closed with skin clips.  A sterile bulky dressing was applied.  The patient was then placed supine.  Knee immobilizer applied and he was transferred to the operating room stretcher and then to the recovery room in satisfactory  condition.  The patient tolerated the procedure without complications.  CN/NUANCE  D:06/29/2019 T:06/30/2019 JOB:[id number]

## 2019-06-30 NOTE — Plan of Care (Signed)
  Problem: Education: Goal: Knowledge of the prescribed therapeutic regimen will improve Outcome: Progressing Goal: Understanding of discharge needs will improve Outcome: Progressing Goal: Individualized Educational Video(s) Outcome: Progressing   

## 2019-07-06 ENCOUNTER — Telehealth: Payer: Self-pay | Admitting: Orthopaedic Surgery

## 2019-07-06 ENCOUNTER — Other Ambulatory Visit: Payer: Self-pay | Admitting: Orthopedic Surgery

## 2019-07-06 MED ORDER — OXYCODONE HCL 5 MG PO TABS: 5.0000 mg | ORAL_TABLET | Freq: Four times a day (QID) | ORAL | 0 refills | Status: DC | PRN

## 2019-07-06 MED ORDER — ONDANSETRON HCL 4 MG PO TABS: 4.0000 mg | ORAL_TABLET | Freq: Three times a day (TID) | ORAL | 1 refills | Status: DC | PRN

## 2019-07-06 NOTE — Telephone Encounter (Signed)
Please advise. Patient had surgery on 06-29-2019

## 2019-07-06 NOTE — Telephone Encounter (Signed)
Patient's wife called stating that the PT advised her to call and request an RX for nausea and also the patient only has enough pain medication for today and is needing a refill.  Her CB#[phone].  Thank you.

## 2019-07-13 ENCOUNTER — Encounter: Payer: Self-pay | Admitting: Orthopedic Surgery

## 2019-07-13 ENCOUNTER — Other Ambulatory Visit: Payer: Self-pay

## 2019-07-13 ENCOUNTER — Ambulatory Visit (INDEPENDENT_AMBULATORY_CARE_PROVIDER_SITE_OTHER): Payer: 59 | Admitting: Orthopedic Surgery

## 2019-07-13 VITALS — Ht 71.0 in | Wt 195.0 lb

## 2019-07-13 DIAGNOSIS — Z96641 Presence of right artificial hip joint: Secondary | ICD-10-CM

## 2019-07-13 NOTE — Progress Notes (Signed)
   Office Visit Note   Patient: [name]           Date of Birth: 11-Nov-1950           MRN: [id number] Visit Date: 07/13/2019              Requested by: Mechele Claude, MD [address] [place],  [place] [address] PCP: Mechele Claude, MD   Assessment & Plan: Visit Diagnoses:  1. Status post total hip replacement, right     Plan:  #1: Staples removed and Steri-Strips were placed. #2: Continue physical therapy as instructed #3: Out of work for the next 4 weeks #4: Follow back up 2 weeks for recheck evaluation  Follow-Up Instructions: No follow-ups on file.   Orders:  No orders of the defined types were placed in this encounter.  No orders of the defined types were placed in this encounter.     Procedures: No procedures performed   Clinical Data: No additional findings.   Subjective: Chief Complaint  Patient presents with  . Right Hip - Follow-up    Right THA DOS 06-29-2019  Patient presents today for follow up on his right hip. He had a right total hip arthroplasty on 06-29-2019. He is now two weeks out from surgery. He is doing home physical therapy twice weekly. He is walking with the assistance of a walker. He takes about one oxycodone tablet daily.   HPI  Review of Systems   Objective: Vital Signs: Ht 5\' 11"  (1.803 m)   Wt 195 lb (88.5 kg)   BMI 27.20 kg/m   Physical Exam  Ortho Exam  Exam today reveals the wound healing per primam with no signs of infection.  Staples removed and Steri-Strips were placed.  Internal and external rotation appear stable.  Flexes to 105 degrees.  Full extension.  Neurovascular intact distally.  Specialty Comments:  No specialty comments available.  Imaging: No results found.   PMFS History: Patient Active Problem List   Diagnosis Date Noted  . Status post total hip replacement, right 06/29/2019  . Unilateral primary osteoarthritis, right hip 06/15/2019  . Aortic atherosclerosis (HCC) 12/12/2017  . Primary  osteoarthritis of both hips 12/01/2017  . Nocturia 12/01/2017  . Palpable mass of lower back 12/01/2017  . Inguinal lymphadenopathy 12/01/2017  . HTN (hypertension) 07/11/2012  . Metabolic syndrome 07/11/2012   Past Medical History:  Diagnosis Date  . Hypertension     History reviewed. No pertinent family history.  Past Surgical History:  Procedure Laterality Date  . NO PAST SURGERIES    . TOTAL HIP ARTHROPLASTY Right 06/29/2019   Procedure: RIGHT TOTAL HIP ARTHROPLASTY;  Surgeon: 07/01/2019, MD;  Location: [facility];  Service: Orthopedics;  Laterality: Right;   Social History   Occupational History  . Not on file  Tobacco Use  . Smoking status: Never Smoker  . Smokeless tobacco: Never Used  Substance and Sexual Activity  . Alcohol use: No  . Drug use: No  . Sexual activity: Not on file

## 2019-07-15 ENCOUNTER — Inpatient Hospital Stay: Payer: 59 | Admitting: Orthopaedic Surgery

## 2019-08-04 ENCOUNTER — Other Ambulatory Visit: Payer: Self-pay

## 2019-08-04 ENCOUNTER — Ambulatory Visit (INDEPENDENT_AMBULATORY_CARE_PROVIDER_SITE_OTHER): Payer: 59 | Admitting: Orthopaedic Surgery

## 2019-08-04 ENCOUNTER — Encounter: Payer: Self-pay | Admitting: Orthopaedic Surgery

## 2019-08-04 VITALS — BP 111/81 | HR 78 | Ht 71.0 in | Wt 188.0 lb

## 2019-08-04 DIAGNOSIS — M1611 Unilateral primary osteoarthritis, right hip: Secondary | ICD-10-CM

## 2019-08-04 NOTE — Progress Notes (Signed)
Office Visit Note   Patient: [name]           Date of Birth: 1950/05/03           MRN: [id number] Visit Date: 08/04/2019              Requested by: Mechele Claude, MD [address] [place],  [place] [address] PCP: Mechele Claude, MD   Assessment & Plan: Visit Diagnoses:  1. Unilateral primary osteoarthritis, right hip     Plan: Just over 1 month status post primary right total hip replacement and doing well.  Does not have any the preoperative pain.  Does have a little discomfort along the posterior aspect of his thigh when he is up for any length of time but no groin pain.  Incision healing without problem but might have a small hematoma-no drainage.  Now using a cane.  Continue with his home exercise program return to see me in 1 month.  Gradually increase his activities as tolerated  Follow-Up Instructions: Return in about 1 month (around 09/03/2019).   Orders:  No orders of the defined types were placed in this encounter.  No orders of the defined types were placed in this encounter.     Procedures: No procedures performed   Clinical Data: No additional findings.   Subjective: Chief Complaint  Patient presents with  . Right Hip - Follow-up    06/29/2019 Right THA  Patient returns for follow up. He is status post right total hip arthroplasty on 06/29/2019.  He has completed physical therapy. He ambulates with a cane if he is going to be up for a while, but sometimes goes without it. He does have an area that feels like it is pulling when he sits down. He denies taking anything for pain.  HPI  Review of Systems   Objective: Vital Signs: BP 111/81   Pulse 78   Ht 5\' 11"  (1.803 m)   Wt 188 lb (85.3 kg)   BMI 26.22 kg/m   Physical Exam  Ortho Exam leg lengths appear to be symmetrical.  Neurovascular exam intact.  Able to get up and down easily from the low sitting position.  Uses a single-point cane.  No groin discomfort with internal and external rotation.   Incision healing without a problem.  No drainage.  Might have a small hematoma  Specialty Comments:  No specialty comments available.  Imaging: No results found.   PMFS History: Patient Active Problem List   Diagnosis Date Noted  . Status post total hip replacement, right 06/29/2019  . Unilateral primary osteoarthritis, right hip 06/15/2019  . Aortic atherosclerosis (HCC) 12/12/2017  . Primary osteoarthritis of both hips 12/01/2017  . Nocturia 12/01/2017  . Palpable mass of lower back 12/01/2017  . Inguinal lymphadenopathy 12/01/2017  . HTN (hypertension) 07/11/2012  . Metabolic syndrome 07/11/2012   Past Medical History:  Diagnosis Date  . Hypertension     History reviewed. No pertinent family history.  Past Surgical History:  Procedure Laterality Date  . NO PAST SURGERIES    . TOTAL HIP ARTHROPLASTY Right 06/29/2019   Procedure: RIGHT TOTAL HIP ARTHROPLASTY;  Surgeon: 07/01/2019, MD;  Location: [facility];  Service: Orthopedics;  Laterality: Right;   Social History   Occupational History  . Not on file  Tobacco Use  . Smoking status: Never Smoker  . Smokeless tobacco: Never Used  Vaping Use  . Vaping Use: Never used  Substance and Sexual Activity  . Alcohol  use: No  . Drug use: No  . Sexual activity: Not on file

## 2019-08-30 ENCOUNTER — Other Ambulatory Visit: Payer: Self-pay | Admitting: *Deleted

## 2019-08-30 MED ORDER — ASPIRIN 81 MG PO CHEW: 81.0000 mg | CHEWABLE_TABLET | Freq: Two times a day (BID) | ORAL | 2 refills | Status: DC

## 2019-09-01 ENCOUNTER — Encounter: Payer: Self-pay | Admitting: Orthopaedic Surgery

## 2019-09-01 ENCOUNTER — Ambulatory Visit (INDEPENDENT_AMBULATORY_CARE_PROVIDER_SITE_OTHER): Payer: 59 | Admitting: Orthopaedic Surgery

## 2019-09-01 ENCOUNTER — Other Ambulatory Visit: Payer: Self-pay

## 2019-09-01 VITALS — BP 119/88 | HR 86 | Ht 71.0 in | Wt 200.0 lb

## 2019-09-01 DIAGNOSIS — M1611 Unilateral primary osteoarthritis, right hip: Secondary | ICD-10-CM

## 2019-09-01 NOTE — Progress Notes (Signed)
Office Visit Note   Patient: [name]           Date of Birth: [birth year]           MRN: [id number] Visit Date: 09/01/2019              Requested by: Mechele Claude, MD [address] [place],  [place] [address] PCP: Mechele Claude, MD   Assessment & Plan: Visit Diagnoses:  1. Unilateral primary osteoarthritis, right hip     Plan: 2 months status post primary right total hip replacement and doing well.  Does not have any the preoperative pain.  He does have some discomfort when he applies any pressure to his incision and particularly certain car seats.  He works for the [employer] and will have to sit in in the vehicle for 6 7 hours a day and I do not think he is ready to do that.  Will reevaluate every 6 weeks and consider return to work at each of those intervals.  He remains unable to work  Follow-Up Instructions: Return in about 6 weeks (around 10/13/2019).   Orders:  No orders of the defined types were placed in this encounter.  No orders of the defined types were placed in this encounter.     Procedures: No procedures performed   Clinical Data: No additional findings.   Subjective: Chief Complaint  Patient presents with  . Right Hip - Follow-up    06/29/2019 Right THA  Patient returns for one month follow up. He is status post right total hip arthroplasty on 06/29/2019. He states that he is doing well. He is having some pain and feels that it is difficult to bend over to put his shoes on, etc. He does have some weakness and ambulates with a cane.  Definitely feeling better and does not have any the pain that he was having preoperatively.  Still has some soreness over the incision when he applies pressure such as in a car seat  HPI  Review of Systems   Objective: Vital Signs: BP (!) 119/88   Pulse 86   Ht 5\' 11"  (1.803 m)   Wt 200 lb (90.7 kg)   BMI 27.89 kg/m   Physical Exam  Ortho Exam right hip incision healing without any problems.  No tenderness or  soreness.  Leg lengths are symmetrical.  Neurologically intact.  No distal edema.  Does use a cane as he still little bit weak Specialty Comments:  No specialty comments available.  Imaging: No results found.   PMFS History: Patient Active Problem List   Diagnosis Date Noted  . Status post total hip replacement, right 06/29/2019  . Unilateral primary osteoarthritis, right hip 06/15/2019  . Aortic atherosclerosis (HCC) 12/12/2017  . Primary osteoarthritis of both hips 12/01/2017  . Nocturia 12/01/2017  . Palpable mass of lower back 12/01/2017  . Inguinal lymphadenopathy 12/01/2017  . HTN (hypertension) 07/11/2012  . Metabolic syndrome 07/11/2012   Past Medical History:  Diagnosis Date  . Hypertension     History reviewed. No pertinent family history.  Past Surgical History:  Procedure Laterality Date  . NO PAST SURGERIES    . TOTAL HIP ARTHROPLASTY Right 06/29/2019   Procedure: RIGHT TOTAL HIP ARTHROPLASTY;  Surgeon: 07/01/2019, MD;  Location: [facility];  Service: Orthopedics;  Laterality: Right;   Social History   Occupational History  . Not on file  Tobacco Use  . Smoking status: Never Smoker  . Smokeless tobacco: Never Used  Vaping Use  . Vaping Use: Never used  Substance and Sexual Activity  . Alcohol use: No  . Drug use: No  . Sexual activity: Not on file

## 2019-10-13 ENCOUNTER — Other Ambulatory Visit: Payer: Self-pay

## 2019-10-13 ENCOUNTER — Encounter: Payer: Self-pay | Admitting: Orthopaedic Surgery

## 2019-10-13 ENCOUNTER — Ambulatory Visit (INDEPENDENT_AMBULATORY_CARE_PROVIDER_SITE_OTHER): Payer: 59 | Admitting: Orthopaedic Surgery

## 2019-10-13 VITALS — Ht 71.0 in | Wt 200.0 lb

## 2019-10-13 DIAGNOSIS — M1611 Unilateral primary osteoarthritis, right hip: Secondary | ICD-10-CM

## 2019-10-13 DIAGNOSIS — Z96641 Presence of right artificial hip joint: Secondary | ICD-10-CM | POA: Diagnosis not present

## 2019-10-13 NOTE — Progress Notes (Signed)
Office Visit Note   Patient: [name]           Date of Birth: 08-12-1950           MRN: [id number] Visit Date: 10/13/2019              Requested by: Robert Claude, MD [id number] [place],  [place] [address] PCP: Robert Claude, MD   Assessment & Plan: Visit Diagnoses:  1. Unilateral primary osteoarthritis, right hip   2. Status post total hip replacement, right     Plan: [name] is just over 3 months status post primary right total hip replacement and relates that he is definitely better than it was before surgery.  He no longer has the pain in his leg that radiates to his foot and does not have the groin discomfort.  He does use the cane on occasion.  He has difficulty when he walks long distances with some mild discomfort.  He does work for the [employer] and does a lot of heavy work he does not think he is ready to do that yet.  He is believe runs out in a month.  I will see him back at that time and keep him out of work until then.  At that point he needs to decide whether he would like to consider retirement.  Follow-Up Instructions: Return in about 1 month (around 11/12/2019).   Orders:  No orders of the defined types were placed in this encounter.  No orders of the defined types were placed in this encounter.     Procedures: No procedures performed   Clinical Data: No additional findings.   Subjective: Chief Complaint  Patient presents with  . Right Hip - Follow-up    Right total hip arthroplasty 06/29/2019  Patient presents today for follow up on his right hip. He had a right total hip arthroplasty on 06/29/2019. He is now 3 months out from surgery. He states that he still has soreness. It bothers him to ride in a car that has seats that press on that area of his hip. He is not taking anything for pain. He does have swelling in his right ankle throughout the day, but it goes away at night.  The only significant problem that he is experiencing now is some pain  along the lateral aspect of his hip when he sits in a car for a length of time and has pressure applied to the area of the incision.  No fever or chills.  On occasion has a little tingling into his right foot and some mild swelling at the end of the day of his ankle  HPI  Review of Systems   Objective: Vital Signs: Ht 5\' 11"  (1.803 m)   Wt 200 lb (90.7 kg)   BMI 27.89 kg/m   Physical Exam Constitutional:      Appearance: He is well-developed.  Eyes:     Pupils: Pupils are equal, round, and reactive to light.  Pulmonary:     Effort: Pulmonary effort is normal.  Skin:    General: Skin is warm and dry.  Neurological:     Mental Status: He is alert and oriented to person, place, and time.  Psychiatric:        Behavior: Behavior normal.     Ortho Exam right hip incision is healed without any problem.  A little tender over the greater trochanter but no skin changes or induration or erythema.  Painless range of motion  of his hip.  Motor and sensory exam is intact to the foot and to his leg.  A little bit of swelling about the ankle as the day progresses that appears to be nonpitting. Specialty Comments:  No specialty comments available.  Imaging: No results found.   PMFS History: Patient Active Problem List   Diagnosis Date Noted  . Status post total hip replacement, right 06/29/2019  . Unilateral primary osteoarthritis, right hip 06/15/2019  . Aortic atherosclerosis ([facility]) 12/12/2017  . Primary osteoarthritis of both hips 12/01/2017  . Nocturia 12/01/2017  . Palpable mass of lower back 12/01/2017  . Inguinal lymphadenopathy 12/01/2017  . HTN (hypertension) 07/11/2012  . Metabolic syndrome 07/11/2012   Past Medical History:  Diagnosis Date  . Hypertension     History reviewed. No pertinent family history.  Past Surgical History:  Procedure Laterality Date  . NO PAST SURGERIES    . TOTAL HIP ARTHROPLASTY Right 06/29/2019   Procedure: RIGHT TOTAL HIP ARTHROPLASTY;   Surgeon: Valeria Batman, MD;  Location: [facility];  Service: Orthopedics;  Laterality: Right;   Social History   Occupational History  . Not on file  Tobacco Use  . Smoking status: Never Smoker  . Smokeless tobacco: Never Used  Vaping Use  . Vaping Use: Never used  Substance and Sexual Activity  . Alcohol use: No  . Drug use: No  . Sexual activity: Not on file

## 2019-11-10 ENCOUNTER — Encounter: Payer: Self-pay | Admitting: Orthopaedic Surgery

## 2019-11-10 ENCOUNTER — Ambulatory Visit (INDEPENDENT_AMBULATORY_CARE_PROVIDER_SITE_OTHER): Payer: 59 | Admitting: Orthopaedic Surgery

## 2019-11-10 ENCOUNTER — Other Ambulatory Visit: Payer: Self-pay

## 2019-11-10 VITALS — Ht 71.0 in | Wt 200.0 lb

## 2019-11-10 DIAGNOSIS — Z96641 Presence of right artificial hip joint: Secondary | ICD-10-CM | POA: Diagnosis not present

## 2019-11-10 DIAGNOSIS — M160 Bilateral primary osteoarthritis of hip: Secondary | ICD-10-CM | POA: Diagnosis not present

## 2019-11-10 NOTE — Progress Notes (Signed)
Office Visit Note   Patient: [name]           Date of Birth: 05/03/1950           MRN: [id number] Visit Date: 11/10/2019              Requested by: Mechele Claude, MD [address] [place],  [place] [address] PCP: Mechele Claude, MD   Assessment & Plan: Visit Diagnoses:  1. Primary osteoarthritis of both hips   2. Status post total hip replacement, right     Plan: 4-1/2 months status post primary right total hip replacement continues to improve.  Having minimal discomfort along the lateral aspect of his right hip in the area of the incision but much better than it was even a month ago.  Again acute moderate work another month and at that point he can decide whether he wants to retire back to work.  Return in 3 months.  Continue with his exercises.  Doing very well.  He does have arthritis in his other hip and might be a good candidate for cortisone injection at some point  Follow-Up Instructions: Return in about 3 months (around 02/10/2020).   Orders:  No orders of the defined types were placed in this encounter.  No orders of the defined types were placed in this encounter.     Procedures: No procedures performed   Clinical Data: No additional findings.   Subjective: Chief Complaint  Patient presents with  . Right Hip - Follow-up    Right total hip arthroplasty 06/29/2019  Patient presents today for a one month follow up on his right hip. He had a right total hip arthroplasty on 06/29/2019. He is now 4 months out from surgery. Patient states that overall he is doing well, but he still has some soreness and it "stings". He takes Advil if needed.   HPI  Review of Systems   Objective: Vital Signs: Ht 5\' 11"  (1.803 m)   Wt 200 lb (90.7 kg)   BMI 27.89 kg/m   Physical Exam Constitutional:      Appearance: He is well-developed.  Eyes:     Pupils: Pupils are equal, round, and reactive to light.  Pulmonary:     Effort: Pulmonary effort is normal.  Skin:     General: Skin is warm and dry.  Neurological:     Mental Status: He is alert and oriented to person, place, and time.  Psychiatric:        Behavior: Behavior normal.     Ortho Exam awake alert and oriented x3.  Comfortable sitting.  Easily able to get up and down from a sitting position.  No pain with range of motion of either hip.  Leg lengths appear to be symmetrical.  Neurologically intact.  Minimal tenderness over the lateral aspect of his incision  Specialty Comments:  No specialty comments available.  Imaging: No results found.   PMFS History: Patient Active Problem List   Diagnosis Date Noted  . Status post total hip replacement, right 06/29/2019  . Unilateral primary osteoarthritis, right hip 06/15/2019  . Aortic atherosclerosis (HCC) 12/12/2017  . Primary osteoarthritis of both hips 12/01/2017  . Nocturia 12/01/2017  . Palpable mass of lower back 12/01/2017  . Inguinal lymphadenopathy 12/01/2017  . HTN (hypertension) 07/11/2012  . Metabolic syndrome 07/11/2012   Past Medical History:  Diagnosis Date  . Hypertension     History reviewed. No pertinent family history.  Past Surgical History:  Procedure Laterality  Date  . NO PAST SURGERIES    . TOTAL HIP ARTHROPLASTY Right 06/29/2019   Procedure: RIGHT TOTAL HIP ARTHROPLASTY;  Surgeon: Valeria Batman, MD;  Location: [facility];  Service: Orthopedics;  Laterality: Right;   Social History   Occupational History  . Not on file  Tobacco Use  . Smoking status: Never Smoker  . Smokeless tobacco: Never Used  Vaping Use  . Vaping Use: Never used  Substance and Sexual Activity  . Alcohol use: No  . Drug use: No  . Sexual activity: Not on file

## 2019-12-12 ENCOUNTER — Other Ambulatory Visit: Payer: Self-pay | Admitting: Family Medicine

## 2019-12-12 DIAGNOSIS — I10 Essential (primary) hypertension: Secondary | ICD-10-CM

## 2020-01-05 ENCOUNTER — Other Ambulatory Visit: Payer: Self-pay | Admitting: Family Medicine

## 2020-01-05 DIAGNOSIS — I10 Essential (primary) hypertension: Secondary | ICD-10-CM

## 2020-01-05 NOTE — Telephone Encounter (Signed)
Stacks NTBS 30 days given 12/13/19

## 2020-01-06 ENCOUNTER — Encounter: Payer: Self-pay | Admitting: *Deleted

## 2020-01-06 ENCOUNTER — Other Ambulatory Visit: Payer: Self-pay | Admitting: *Deleted

## 2020-01-06 DIAGNOSIS — I10 Essential (primary) hypertension: Secondary | ICD-10-CM

## 2020-01-06 MED ORDER — AMLODIPINE BESYLATE 10 MG PO TABS: 10.0000 mg | ORAL_TABLET | Freq: Every day | ORAL | 0 refills | Status: DC

## 2020-01-06 MED ORDER — LISINOPRIL 10 MG PO TABS: 10.0000 mg | ORAL_TABLET | Freq: Every day | ORAL | 0 refills | Status: DC

## 2020-01-06 MED ORDER — HYDROCHLOROTHIAZIDE 12.5 MG PO CAPS: 12.5000 mg | ORAL_CAPSULE | Freq: Every day | ORAL | 0 refills | Status: DC

## 2020-02-01 ENCOUNTER — Other Ambulatory Visit: Payer: Self-pay | Admitting: Family Medicine

## 2020-02-01 DIAGNOSIS — I10 Essential (primary) hypertension: Secondary | ICD-10-CM

## 2020-02-08 ENCOUNTER — Other Ambulatory Visit: Payer: Self-pay

## 2020-02-08 ENCOUNTER — Ambulatory Visit (INDEPENDENT_AMBULATORY_CARE_PROVIDER_SITE_OTHER): Payer: 59 | Admitting: Family Medicine

## 2020-02-08 ENCOUNTER — Encounter: Payer: Self-pay | Admitting: Family Medicine

## 2020-02-08 VITALS — BP 136/82 | HR 79 | Temp 98.2°F | Ht 71.0 in | Wt 201.0 lb

## 2020-02-08 DIAGNOSIS — I700 Atherosclerosis of aorta: Secondary | ICD-10-CM | POA: Diagnosis not present

## 2020-02-08 DIAGNOSIS — I10 Essential (primary) hypertension: Secondary | ICD-10-CM | POA: Diagnosis not present

## 2020-02-08 DIAGNOSIS — M160 Bilateral primary osteoarthritis of hip: Secondary | ICD-10-CM | POA: Diagnosis not present

## 2020-02-08 NOTE — Progress Notes (Signed)
Subjective:  Patient ID: [name], male    DOB: 1950-07-28  Age: 70 y.o. MRN: [id number]  CC: Annual Exam   HPI [name] presents for complete physical.  Depression screen [facility] 2/9 02/08/2020 06/09/2019 11/25/2018  Decreased Interest 0 0 0  Down, Depressed, Hopeless 0 0 1  PHQ - 2 Score 0 0 1    History [name] has a past medical history of Hypertension.   [name] has a past surgical history that includes No past surgeries and Total hip arthroplasty (Right, 06/29/2019).   His family history is not on file.[name] reports that [name] has never smoked. [name] has never used smokeless tobacco. [name] reports that [name] does not drink alcohol and does not use drugs.    ROS Review of Systems  Constitutional: Negative.   HENT: Negative.   Eyes: Negative for visual disturbance.  Respiratory: Negative for cough and shortness of breath.   Cardiovascular: Negative for chest pain and leg swelling.  Gastrointestinal: Negative for abdominal pain, diarrhea, nausea and vomiting.  Genitourinary: Negative for difficulty urinating.  Musculoskeletal: Positive for arthralgias. Negative for myalgias.  Skin: Negative for rash.  Neurological: Negative for headaches.  Psychiatric/Behavioral: Negative for sleep disturbance.    Objective:  BP 136/82   Pulse 79   Temp 98.2 F (36.8 C) (Temporal)   Ht $R'5\' 11"'gY$  (1.803 m)   Wt 201 lb (91.2 kg)   BMI 28.03 kg/m   BP Readings from Last 3 Encounters:  02/08/20 136/82  09/01/19 (!) 119/88  08/04/19 111/81    Wt Readings from Last 3 Encounters:  02/08/20 201 lb (91.2 kg)  11/10/19 200 lb (90.7 kg)  10/13/19 200 lb (90.7 kg)     Physical Exam Constitutional:      General: [name] is not in acute distress.    Appearance: [name] is well-developed.  HENT:     Head: Normocephalic and atraumatic.     Right Ear: External ear normal.     Left Ear: External ear normal.     Nose: Nose normal.  Eyes:     Conjunctiva/sclera: Conjunctivae normal.     Pupils: Pupils are  equal, round, and reactive to light.  Cardiovascular:     Rate and Rhythm: Normal rate and regular rhythm.     Heart sounds: Normal heart sounds. No murmur heard.   Pulmonary:     Effort: Pulmonary effort is normal. No respiratory distress.     Breath sounds: Normal breath sounds. No wheezing or rales.  Abdominal:     Palpations: Abdomen is soft.     Tenderness: There is no abdominal tenderness.  Musculoskeletal:        General: Normal range of motion.     Cervical back: Normal range of motion and neck supple.  Skin:    General: Skin is warm and dry.  Neurological:     Mental Status: [name] is alert and oriented to person, place, and time.     Deep Tendon Reflexes: Reflexes are normal and symmetric.  Psychiatric:        Behavior: Behavior normal.        Thought Content: Thought content normal.        Judgment: Judgment normal.       Assessment & Plan:   [name] was seen today for annual exam.  Diagnoses and all orders for this visit:  Aortic atherosclerosis ([place]) -     CBC with Differential/Platelet -     CMP14+EGFR -     Lipid panel -  PSA, total and free -     VITAMIN D 25 Hydroxy (Vit-D Deficiency, Fractures) -     Urinalysis  Primary osteoarthritis of both hips -     CBC with Differential/Platelet -     CMP14+EGFR -     Lipid panel -     PSA, total and free -     VITAMIN D 25 Hydroxy (Vit-D Deficiency, Fractures) -     Urinalysis  Primary hypertension -     CBC with Differential/Platelet -     CMP14+EGFR -     Lipid panel -     PSA, total and free -     VITAMIN D 25 Hydroxy (Vit-D Deficiency, Fractures) -     Urinalysis       I have discontinued Coralyn Mark Creson's methocarbamol, oxyCODONE, and ondansetron. I am also having him maintain his acetaminophen, aspirin, amLODipine, hydrochlorothiazide, and lisinopril.  Allergies as of 02/08/2020   No Known Allergies     Medication List       Accurate as of February 08, 2020 11:46 AM. If you have any  questions, ask your nurse or doctor.        STOP taking these medications   methocarbamol 500 MG tablet Commonly known as: ROBAXIN Stopped by: Claretta Fraise, MD   ondansetron 4 MG tablet Commonly known as: Zofran Stopped by: Claretta Fraise, MD   oxyCODONE 5 MG immediate release tablet Commonly known as: Oxy IR/ROXICODONE Stopped by: Claretta Fraise, MD     TAKE these medications   acetaminophen 325 MG tablet Commonly known as: TYLENOL Take 1-2 tablets (325-650 mg total) by mouth every 6 (six) hours as needed for mild pain (pain score 1-3 or temp > 100.5).   amLODipine 10 MG tablet Commonly known as: NORVASC TAKE 1 TABLET BY MOUTH DAILY. (NEEDS TO BE SEEN BEFORE NEXT REFILL)   aspirin 81 MG chewable tablet Chew 1 tablet (81 mg total) by mouth 2 (two) times daily.   hydrochlorothiazide 12.5 MG capsule Commonly known as: MICROZIDE TAKE 1 CAPSULE BY MOUTH DAILY. (NEEDS TO BE SEEN BEFORE NEXT REFILL)   lisinopril 10 MG tablet Commonly known as: ZESTRIL TAKE 1 TABLET BY MOUTH DAILY. (NEEDS TO BE SEEN BEFORE NEXT REFILL)        Follow-up: Return in about 6 months (around 08/07/2020).  Claretta Fraise, M.D.

## 2020-02-09 ENCOUNTER — Other Ambulatory Visit: Payer: Self-pay | Admitting: Family Medicine

## 2020-02-09 LAB — CBC WITH DIFFERENTIAL/PLATELET
Basophils Absolute: 0.1 10*3/uL (ref 0.0–0.2)
Basos: 1 %
EOS (ABSOLUTE): 0.1 10*3/uL (ref 0.0–0.4)
Eos: 1 %
Hematocrit: 46.5 % (ref 37.5–51.0)
Hemoglobin: 16 g/dL (ref 13.0–17.7)
Immature Grans (Abs): 0 10*3/uL (ref 0.0–0.1)
Immature Granulocytes: 0 %
Lymphocytes Absolute: 1.2 10*3/uL (ref 0.7–3.1)
Lymphs: 14 %
MCH: 29.5 pg (ref 26.6–33.0)
MCHC: 34.4 g/dL (ref 31.5–35.7)
MCV: 86 fL (ref 79–97)
Monocytes Absolute: 0.7 10*3/uL (ref 0.1–0.9)
Monocytes: 8 %
Neutrophils Absolute: 6.6 10*3/uL (ref 1.4–7.0)
Neutrophils: 76 %
Platelets: 253 10*3/uL (ref 150–450)
RBC: 5.43 x10E6/uL (ref 4.14–5.80)
RDW: 13.4 % (ref 11.6–15.4)
WBC: 8.6 10*3/uL (ref 3.4–10.8)

## 2020-02-09 LAB — PSA, TOTAL AND FREE
PSA, Free Pct: 21.3 %
PSA, Free: 0.32 ng/mL
Prostate Specific Ag, Serum: 1.5 ng/mL (ref 0.0–4.0)

## 2020-02-09 LAB — CMP14+EGFR
ALT: 38 IU/L (ref 0–44)
AST: 54 IU/L — ABNORMAL HIGH (ref 0–40)
Albumin/Globulin Ratio: 1.9 (ref 1.2–2.2)
Albumin: 4.6 g/dL (ref 3.8–4.8)
Alkaline Phosphatase: 94 IU/L (ref 44–121)
BUN/Creatinine Ratio: 14 (ref 10–24)
BUN: 17 mg/dL (ref 8–27)
Bilirubin Total: 0.7 mg/dL (ref 0.0–1.2)
CO2: 23 mmol/L (ref 20–29)
Calcium: 10.2 mg/dL (ref 8.6–10.2)
Chloride: 101 mmol/L (ref 96–106)
Creatinine, Ser: 1.19 mg/dL (ref 0.76–1.27)
GFR calc Af Amer: 73 mL/min/{1.73_m2} (ref >59)
GFR calc non Af Amer: 63 mL/min/{1.73_m2} (ref >59)
Globulin, Total: 2.4 g/dL (ref 1.5–4.5)
Glucose: 133 mg/dL — ABNORMAL HIGH (ref 65–99)
Potassium: 3.8 mmol/L (ref 3.5–5.2)
Sodium: 138 mmol/L (ref 134–144)
Total Protein: 7 g/dL (ref 6.0–8.5)

## 2020-02-09 LAB — LIPID PANEL
Chol/HDL Ratio: 3.6 ratio (ref 0.0–5.0)
Cholesterol, Total: 140 mg/dL (ref 100–199)
HDL: 39 mg/dL — ABNORMAL LOW (ref >39)
LDL Chol Calc (NIH): 80 mg/dL (ref 0–99)
Triglycerides: 112 mg/dL (ref 0–149)
VLDL Cholesterol Cal: 21 mg/dL (ref 5–40)

## 2020-02-09 LAB — VITAMIN D 25 HYDROXY (VIT D DEFICIENCY, FRACTURES): Vit D, 25-Hydroxy: 29.1 ng/mL — ABNORMAL LOW (ref 30.0–100.0)

## 2020-02-09 MED ORDER — VITAMIN D (ERGOCALCIFEROL) 1.25 MG (50000 UNIT) PO CAPS: 50000.0000 [IU] | ORAL_CAPSULE | ORAL | 3 refills | Status: AC

## 2020-02-26 ENCOUNTER — Other Ambulatory Visit: Payer: Self-pay | Admitting: Family Medicine

## 2020-02-26 DIAGNOSIS — I10 Essential (primary) hypertension: Secondary | ICD-10-CM

## 2020-06-06 ENCOUNTER — Other Ambulatory Visit: Payer: Self-pay | Admitting: Family Medicine

## 2020-08-03 ENCOUNTER — Encounter: Payer: Self-pay | Admitting: Family Medicine

## 2020-08-03 ENCOUNTER — Other Ambulatory Visit: Payer: Self-pay

## 2020-08-03 ENCOUNTER — Ambulatory Visit: Payer: 59 | Admitting: Family Medicine

## 2020-08-03 VITALS — BP 135/80 | HR 74 | Temp 98.0°F | Ht 71.0 in | Wt 189.2 lb

## 2020-08-03 DIAGNOSIS — E785 Hyperlipidemia, unspecified: Secondary | ICD-10-CM | POA: Diagnosis not present

## 2020-08-03 DIAGNOSIS — I10 Essential (primary) hypertension: Secondary | ICD-10-CM | POA: Diagnosis not present

## 2020-08-03 DIAGNOSIS — E559 Vitamin D deficiency, unspecified: Secondary | ICD-10-CM

## 2020-08-03 MED ORDER — HYDROCHLOROTHIAZIDE 12.5 MG PO CAPS: 12.5000 mg | ORAL_CAPSULE | Freq: Every day | ORAL | 3 refills | Status: DC

## 2020-08-03 MED ORDER — LISINOPRIL 10 MG PO TABS: 10.0000 mg | ORAL_TABLET | Freq: Every day | ORAL | 3 refills | Status: DC

## 2020-08-03 MED ORDER — AMLODIPINE BESYLATE 10 MG PO TABS: 10.0000 mg | ORAL_TABLET | Freq: Every day | ORAL | 3 refills | Status: DC

## 2020-08-03 NOTE — Progress Notes (Signed)
Subjective:  Patient ID: [name], male    DOB: 07-09-1950  Age: 70 y.o. MRN: [id number]  CC: Medical Management of Chronic Issues   HPI [name] presents for  follow-up of hypertension. Patient has no history of headache chest pain or shortness of breath or recent cough. Patient also denies symptoms of TIA such as focal numbness or weakness. Patient denies side effects from medication. States taking it regularly.  Patient in for follow-up of elevated cholesterol. Doing well without complaints on current medication. Denies side effects of statin including myalgia and arthralgia and nausea. Also in today for liver function testing. Currently no chest pain, shortness of breath or other cardiovascular related symptoms noted.  Is also due for monitoring of his vitamin D level today.   History [name] has a past medical history of Hypertension.   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   His family history is not on file.He reports that he has never smoked. He has never used smokeless tobacco. He reports that he does not drink alcohol and does not use drugs.  Current Outpatient Medications on File Prior to Visit  Medication Sig Dispense Refill   acetaminophen (TYLENOL) 325 MG tablet Take 1-2 tablets (325-650 mg total) by mouth every 6 (six) hours as needed for mild pain (pain score 1-3 or temp > 100.5).     [facility] ASPIRIN ADULT LOW DOSE 81 MG chewable tablet CHEW 1 TABLET (81 MG TOTAL) BY MOUTH 2 (TWO) TIMES DAILY. 180 tablet 2   Vitamin D, Ergocalciferol, (DRISDOL) 1.25 MG (50000 UNIT) CAPS capsule Take 1 capsule (50,000 Units total) by mouth every 7 (seven) days. 13 capsule 3   No current facility-administered medications on file prior to visit.    ROS Review of Systems  Constitutional:  Negative for fever.  Respiratory:  Negative for shortness of breath.   Cardiovascular:  Negative for chest pain.   Musculoskeletal:  Negative for arthralgias.  Skin:  Negative for rash.   Objective:  BP 135/80   Pulse 74   Temp 98 F (36.7 C)   Ht 5\' 11"  (1.803 m)   Wt 189 lb 3.2 oz (85.8 kg)   SpO2 97%   BMI 26.39 kg/m   BP Readings from Last 3 Encounters:  08/03/20 135/80  02/08/20 136/82  09/01/19 (!) 119/88    Wt Readings from Last 3 Encounters:  08/03/20 189 lb 3.2 oz (85.8 kg)  02/08/20 201 lb (91.2 kg)  11/10/19 200 lb (90.7 kg)     Physical Exam Vitals reviewed.  Constitutional:      Appearance: He is well-developed.  HENT:     Head: Normocephalic and atraumatic.     Right Ear: External ear normal.     Left Ear: External ear normal.     Mouth/Throat:     Pharynx: No oropharyngeal exudate or posterior oropharyngeal erythema.  Eyes:     Pupils: Pupils are equal, round, and reactive to light.  Cardiovascular:     Rate and Rhythm: Normal rate and regular rhythm.     Heart sounds: No murmur heard. Pulmonary:     Effort: No respiratory distress.     Breath sounds: Normal breath sounds.  Musculoskeletal:     Cervical back: Normal range of motion and neck supple.  Neurological:     Mental Status: He is alert and oriented to person, place, and time.      Assessment & Plan:  [name] was seen today for medical management of chronic issues.  Diagnoses and all orders for this visit:  Hyperlipidemia, unspecified hyperlipidemia type -     Lipid panel  Essential hypertension -     amLODipine (NORVASC) 10 MG tablet; Take 1 tablet (10 mg total) by mouth daily. -     hydrochlorothiazide (MICROZIDE) 12.5 MG capsule; Take 1 capsule (12.5 mg total) by mouth daily. -     lisinopril (ZESTRIL) 10 MG tablet; Take 1 tablet (10 mg total) by mouth daily. -     CBC with Differential/Platelet -     CMP14+EGFR  Vitamin D deficiency -     VITAMIN D 25 Hydroxy (Vit-D Deficiency, Fractures)   Allergies as of 08/03/2020   No Known Allergies      Medication List         Accurate as of August 03, 2020 10:54 AM. If you have any questions, ask your nurse or doctor.          acetaminophen 325 MG tablet Commonly known as: TYLENOL Take 1-2 tablets (325-650 mg total) by mouth every 6 (six) hours as needed for mild pain (pain score 1-3 or temp > 100.5).   amLODipine 10 MG tablet Commonly known as: NORVASC Take 1 tablet (10 mg total) by mouth daily.   [facility] Aspirin Adult Low Dose 81 MG chewable tablet Generic drug: aspirin CHEW 1 TABLET (81 MG TOTAL) BY MOUTH 2 (TWO) TIMES DAILY.   hydrochlorothiazide 12.5 MG capsule Commonly known as: MICROZIDE Take 1 capsule (12.5 mg total) by mouth daily.   lisinopril 10 MG tablet Commonly known as: ZESTRIL Take 1 tablet (10 mg total) by mouth daily.   Vitamin D (Ergocalciferol) 1.25 MG (50000 UNIT) Caps capsule Commonly known as: DRISDOL Take 1 capsule (50,000 Units total) by mouth every 7 (seven) days.        Meds ordered this encounter  Medications   amLODipine (NORVASC) 10 MG tablet    Sig: Take 1 tablet (10 mg total) by mouth daily.    Dispense:  90 tablet    Refill:  3   hydrochlorothiazide (MICROZIDE) 12.5 MG capsule    Sig: Take 1 capsule (12.5 mg total) by mouth daily.    Dispense:  90 capsule    Refill:  3   lisinopril (ZESTRIL) 10 MG tablet    Sig: Take 1 tablet (10 mg total) by mouth daily.    Dispense:  90 tablet    Refill:  3      Follow-up: No follow-ups on file.  Claretta Fraise, M.D.

## 2020-08-04 ENCOUNTER — Encounter: Payer: Self-pay | Admitting: Family Medicine

## 2020-08-04 LAB — CMP14+EGFR
ALT: 30 IU/L (ref 0–44)
AST: 36 IU/L (ref 0–40)
Albumin/Globulin Ratio: 1.8 (ref 1.2–2.2)
Albumin: 4.5 g/dL (ref 3.8–4.8)
Alkaline Phosphatase: 97 IU/L (ref 44–121)
BUN/Creatinine Ratio: 17 (ref 10–24)
BUN: 18 mg/dL (ref 8–27)
Bilirubin Total: 0.8 mg/dL (ref 0.0–1.2)
CO2: 23 mmol/L (ref 20–29)
Calcium: 10.4 mg/dL — ABNORMAL HIGH (ref 8.6–10.2)
Chloride: 103 mmol/L (ref 96–106)
Creatinine, Ser: 1.04 mg/dL (ref 0.76–1.27)
Globulin, Total: 2.5 g/dL (ref 1.5–4.5)
Glucose: 107 mg/dL — ABNORMAL HIGH (ref 65–99)
Potassium: 4.2 mmol/L (ref 3.5–5.2)
Sodium: 141 mmol/L (ref 134–144)
Total Protein: 7 g/dL (ref 6.0–8.5)
eGFR: 79 mL/min/{1.73_m2} (ref >59)

## 2020-08-04 LAB — CBC WITH DIFFERENTIAL/PLATELET
Basophils Absolute: 0.1 10*3/uL (ref 0.0–0.2)
Basos: 1 %
EOS (ABSOLUTE): 0.1 10*3/uL (ref 0.0–0.4)
Eos: 1 %
Hematocrit: 44.1 % (ref 37.5–51.0)
Hemoglobin: 15 g/dL (ref 13.0–17.7)
Immature Grans (Abs): 0 10*3/uL (ref 0.0–0.1)
Immature Granulocytes: 0 %
Lymphocytes Absolute: 2.5 10*3/uL (ref 0.7–3.1)
Lymphs: 32 %
MCH: 29.6 pg (ref 26.6–33.0)
MCHC: 34 g/dL (ref 31.5–35.7)
MCV: 87 fL (ref 79–97)
Monocytes Absolute: 0.7 10*3/uL (ref 0.1–0.9)
Monocytes: 8 %
Neutrophils Absolute: 4.5 10*3/uL (ref 1.4–7.0)
Neutrophils: 58 %
Platelets: 223 10*3/uL (ref 150–450)
RBC: 5.07 x10E6/uL (ref 4.14–5.80)
RDW: 13.5 % (ref 11.6–15.4)
WBC: 7.8 10*3/uL (ref 3.4–10.8)

## 2020-08-04 LAB — LIPID PANEL
Chol/HDL Ratio: 3.5 ratio (ref 0.0–5.0)
Cholesterol, Total: 132 mg/dL (ref 100–199)
HDL: 38 mg/dL — ABNORMAL LOW (ref >39)
LDL Chol Calc (NIH): 69 mg/dL (ref 0–99)
Triglycerides: 146 mg/dL (ref 0–149)
VLDL Cholesterol Cal: 25 mg/dL (ref 5–40)

## 2020-08-04 LAB — VITAMIN D 25 HYDROXY (VIT D DEFICIENCY, FRACTURES): Vit D, 25-Hydroxy: 60.8 ng/mL (ref 30.0–100.0)

## 2020-08-04 NOTE — Progress Notes (Signed)
Hello [name],  Your lab result is normal and/or stable.Some minor variations that are not significant are commonly marked abnormal, but do not represent any medical problem for you.  Best regards, Jaquail Mclees, M.D.

## 2021-01-31 ENCOUNTER — Ambulatory Visit: Payer: 59 | Admitting: Family Medicine

## 2021-02-10 ENCOUNTER — Other Ambulatory Visit: Payer: Self-pay | Admitting: Family Medicine

## 2021-02-12 ENCOUNTER — Ambulatory Visit: Payer: 59 | Admitting: Family Medicine

## 2021-02-12 ENCOUNTER — Encounter: Payer: Self-pay | Admitting: Family Medicine

## 2021-02-12 VITALS — BP 135/69 | HR 73 | Temp 99.4°F | Ht 71.0 in | Wt 194.0 lb

## 2021-02-12 DIAGNOSIS — I10 Essential (primary) hypertension: Secondary | ICD-10-CM

## 2021-02-12 DIAGNOSIS — Z125 Encounter for screening for malignant neoplasm of prostate: Secondary | ICD-10-CM | POA: Diagnosis not present

## 2021-02-12 DIAGNOSIS — E785 Hyperlipidemia, unspecified: Secondary | ICD-10-CM

## 2021-02-12 MED ORDER — CELECOXIB 200 MG PO CAPS: 200.0000 mg | ORAL_CAPSULE | Freq: Every day | ORAL | 5 refills | Status: DC

## 2021-02-12 NOTE — Progress Notes (Signed)
Subjective:  Patient ID: [name], male    DOB: 1950-06-14  Age: 71 y.o. MRN: [id number]  CC: Medical Management of Chronic Issues   HPI [name] presents for  follow-up of hypertension. Patient has no history of headache chest pain or shortness of breath or recent cough. Patient also denies symptoms of TIA such as focal numbness or weakness. Patient denies side effects from medication. States taking it regularly. Not taking cholesterol medication. Has excellent results of Lipid profile 6 mos ago.  History [name] has a past medical history of Hypertension.   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   His family history is not on file.He reports that he has never smoked. He has never used smokeless tobacco. He reports that he does not drink alcohol and does not use drugs.  Current Outpatient Medications on File Prior to Visit  Medication Sig Dispense Refill   acetaminophen (TYLENOL) 325 MG tablet Take 1-2 tablets (325-650 mg total) by mouth every 6 (six) hours as needed for mild pain (pain score 1-3 or temp > 100.5).     amLODipine (NORVASC) 10 MG tablet Take 1 tablet (10 mg total) by mouth daily. 90 tablet 3   [facility] ASPIRIN ADULT LOW DOSE 81 MG chewable tablet CHEW 1 TABLET (81 MG TOTAL) BY MOUTH 2 (TWO) TIMES DAILY. 180 tablet 2   hydrochlorothiazide (MICROZIDE) 12.5 MG capsule Take 1 capsule (12.5 mg total) by mouth daily. 90 capsule 3   lisinopril (ZESTRIL) 10 MG tablet Take 1 tablet (10 mg total) by mouth daily. 90 tablet 3   No current facility-administered medications on file prior to visit.    ROS Review of Systems  Constitutional:  Negative for fever.  Respiratory:  Negative for shortness of breath.   Cardiovascular:  Negative for chest pain.  Musculoskeletal:  Negative for arthralgias.  Skin:  Negative for rash.   Objective:  BP 135/69    Pulse 73    Temp 99.4 F (37.4 C)    Ht _0  (1.803 m)     Wt 194 lb (88 kg)    SpO2 96%    BMI 27.06 kg/m   BP Readings from Last 3 Encounters:  02/12/21 135/69  08/03/20 135/80  02/08/20 136/82    Wt Readings from Last 3 Encounters:  02/12/21 194 lb (88 kg)  08/03/20 189 lb 3.2 oz (85.8 kg)  02/08/20 201 lb (91.2 kg)     Physical Exam    Assessment & Plan:   [name] was seen today for medical management of chronic issues.  Diagnoses and all orders for this visit:  Essential hypertension -     CBC with Differential/Platelet -     CMP14+EGFR  Hyperlipidemia, unspecified hyperlipidemia type -     Cancel: Lipid panel  Prostate cancer screening -     PSA, total and free  Other orders -     celecoxib (CELEBREX) 200 MG capsule; Take 1 capsule (200 mg total) by mouth daily. With food   Allergies as of 02/12/2021   No Known Allergies      Medication List        Accurate as of February 12, 2021  8:49 PM. If you have any questions, ask your nurse or doctor.          acetaminophen 325 MG tablet Commonly known as: TYLENOL Take 1-2 tablets (325-650 mg total) by mouth every 6 (six) hours as needed for  mild pain (pain score 1-3 or temp > 100.5).   amLODipine 10 MG tablet Commonly known as: NORVASC Take 1 tablet (10 mg total) by mouth daily.   celecoxib 200 MG capsule Commonly known as: CeleBREX Take 1 capsule (200 mg total) by mouth daily. With food Started by: Claretta Fraise, MD   [facility] Aspirin Adult Low Dose 81 MG chewable tablet Generic drug: aspirin CHEW 1 TABLET (81 MG TOTAL) BY MOUTH 2 (TWO) TIMES DAILY.   hydrochlorothiazide 12.5 MG capsule Commonly known as: MICROZIDE Take 1 capsule (12.5 mg total) by mouth daily.   lisinopril 10 MG tablet Commonly known as: ZESTRIL Take 1 tablet (10 mg total) by mouth daily.        Meds ordered this encounter  Medications   celecoxib (CELEBREX) 200 MG capsule    Sig: Take 1 capsule (200 mg total) by mouth daily. With food    Dispense:  30 capsule    Refill:  5       Follow-up: Return in about 6 months (around 08/12/2021).  Claretta Fraise, M.D.

## 2021-02-13 LAB — LIPID PANEL
Chol/HDL Ratio: 3.8 ratio (ref 0.0–5.0)
Cholesterol, Total: 140 mg/dL (ref 100–199)
HDL: 37 mg/dL — ABNORMAL LOW (ref >39)
LDL Chol Calc (NIH): 75 mg/dL (ref 0–99)
Triglycerides: 162 mg/dL — ABNORMAL HIGH (ref 0–149)
VLDL Cholesterol Cal: 28 mg/dL (ref 5–40)

## 2021-02-13 LAB — CMP14+EGFR
ALT: 28 IU/L (ref 0–44)
AST: 30 IU/L (ref 0–40)
Albumin/Globulin Ratio: 1.5 (ref 1.2–2.2)
Albumin: 4.3 g/dL (ref 3.8–4.8)
Alkaline Phosphatase: 90 IU/L (ref 44–121)
BUN/Creatinine Ratio: 17 (ref 10–24)
BUN: 18 mg/dL (ref 8–27)
Bilirubin Total: 0.3 mg/dL (ref 0.0–1.2)
CO2: 23 mmol/L (ref 20–29)
Calcium: 10.1 mg/dL (ref 8.6–10.2)
Chloride: 104 mmol/L (ref 96–106)
Creatinine, Ser: 1.09 mg/dL (ref 0.76–1.27)
Globulin, Total: 2.8 g/dL (ref 1.5–4.5)
Glucose: 143 mg/dL — ABNORMAL HIGH (ref 70–99)
Potassium: 3.8 mmol/L (ref 3.5–5.2)
Sodium: 142 mmol/L (ref 134–144)
Total Protein: 7.1 g/dL (ref 6.0–8.5)
eGFR: 74 mL/min/{1.73_m2} (ref >59)

## 2021-02-13 LAB — CBC WITH DIFFERENTIAL/PLATELET
Basophils Absolute: 0.1 10*3/uL (ref 0.0–0.2)
Basos: 1 %
EOS (ABSOLUTE): 0.2 10*3/uL (ref 0.0–0.4)
Eos: 2 %
Hematocrit: 46.6 % (ref 37.5–51.0)
Hemoglobin: 16 g/dL (ref 13.0–17.7)
Immature Grans (Abs): 0 10*3/uL (ref 0.0–0.1)
Immature Granulocytes: 0 %
Lymphocytes Absolute: 2 10*3/uL (ref 0.7–3.1)
Lymphs: 25 %
MCH: 29.6 pg (ref 26.6–33.0)
MCHC: 34.3 g/dL (ref 31.5–35.7)
MCV: 86 fL (ref 79–97)
Monocytes Absolute: 0.7 10*3/uL (ref 0.1–0.9)
Monocytes: 9 %
Neutrophils Absolute: 4.9 10*3/uL (ref 1.4–7.0)
Neutrophils: 63 %
Platelets: 224 10*3/uL (ref 150–450)
RBC: 5.4 x10E6/uL (ref 4.14–5.80)
RDW: 12.9 % (ref 11.6–15.4)
WBC: 7.8 10*3/uL (ref 3.4–10.8)

## 2021-02-13 LAB — PSA, TOTAL AND FREE
PSA, Free Pct: 26.4 %
PSA, Free: 0.37 ng/mL
Prostate Specific Ag, Serum: 1.4 ng/mL (ref 0.0–4.0)

## 2021-02-19 LAB — SPECIMEN STATUS REPORT

## 2021-02-19 LAB — HGB A1C W/O EAG: Hgb A1c MFr Bld: 6.1 % — ABNORMAL HIGH (ref 4.8–5.6)

## 2021-04-05 ENCOUNTER — Ambulatory Visit (INDEPENDENT_AMBULATORY_CARE_PROVIDER_SITE_OTHER): Payer: 59 | Admitting: Pharmacist

## 2021-04-05 DIAGNOSIS — R7303 Prediabetes: Secondary | ICD-10-CM

## 2021-04-05 NOTE — Progress Notes (Signed)
? ? ?  04/05/2021 ?Name: Derward Marple MRN: [id number] DOB: [birth year] ? ? ?S:  15 yoM Presents for pre-diabetes evaluation, education, and management ?Patient was referred and last seen by Primary Care Provider on 02/12/21. ?He was told he now has a diagnosis of prediabetes--A1c-6% on 02/12/21.  Patient denies family history of diabetes. ? ?Insurance coverage/medication affordability: aetna ? ?Patient reports adherence with medications. ?Current diabetes medications include: n/a ?Current hypertension medications include: amlodipine, lisinopril, hctz ?Goal 130/80 ?Current hyperlipidemia medications include: n/a; controlled ? ? ? ?Patient reported dietary habits: Eats 3 meals/day ?He states his eating and his wife's cooking are the "issue" ?They are working together to eat healthier ? ?Patient-reported exercise habits: started walking ? ?O: ? ?Lab Results  ?Component Value Date  ? HGBA1C 6.1 (H) 02/12/2021  ? ? ?Lipid Panel ? ?   ?Component Value Date/Time  ? CHOL 140 02/12/2021 1655  ? TRIG 162 (H) 02/12/2021 1655  ? HDL 37 (L) 02/12/2021 1655  ? CHOLHDL 3.8 02/12/2021 1655  ? LDLCALC 75 02/12/2021 1655  ? ? ? Home fasting blood sugars: n/a ? ?2 hour post-meal/random blood sugars: n/a. ?  ? ?Clinical Atherosclerotic Cardiovascular Disease (ASCVD): No  ? ?The 10-year ASCVD risk score (Arnett DK, et al., 2019) is: 18.8% ?  Values used to calculate the score: ?    Age: 71 years ?    Sex: Male ?    Is Non-Hispanic African American: No ?    Diabetic: No ?    Tobacco smoker: No ?    Systolic Blood Pressure: 135 mmHg ?    Is BP treated: Yes ?    HDL Cholesterol: 37 mg/dL ?    Total Cholesterol: 140 mg/dL ?  ? ?A/P: ? ?Pre-diabetes, A1c of 6.1% ?Continue lifestyle modifications ? ?-Extensively discussed pathophysiology of diabetes, recommended lifestyle interventions, dietary effects on blood sugar control ? ?-Educational handouts provided on diet/lifestyle for prediabetes/diabetes ? ?  ? ?Written patient instructions provided.   Total time in face to face counseling 30 minutes.  ? ? ? ?Kieth Brightly, PharmD, BCPS ?Clinical Pharmacist, [facility] ?[place]  [place] Phone [phone] ? ? ?

## 2021-04-11 DIAGNOSIS — R7303 Prediabetes: Secondary | ICD-10-CM | POA: Insufficient documentation

## 2021-04-20 ENCOUNTER — Emergency Department (HOSPITAL_COMMUNITY)
Admission: EM | Admit: 2021-04-20 | Discharge: 2021-04-20 | Disposition: A | Discharge status: Home / Self Care | Payer: 59 | Attending: Emergency Medicine | Admitting: Emergency Medicine

## 2021-04-20 ENCOUNTER — Encounter (HOSPITAL_COMMUNITY): Payer: Self-pay | Admitting: Emergency Medicine

## 2021-04-20 ENCOUNTER — Emergency Department (HOSPITAL_COMMUNITY): Payer: 59

## 2021-04-20 DIAGNOSIS — Z7982 Long term (current) use of aspirin: Secondary | ICD-10-CM | POA: Diagnosis not present

## 2021-04-20 DIAGNOSIS — R109 Unspecified abdominal pain: Secondary | ICD-10-CM | POA: Diagnosis present

## 2021-04-20 DIAGNOSIS — N23 Unspecified renal colic: Secondary | ICD-10-CM | POA: Diagnosis not present

## 2021-04-20 LAB — BASIC METABOLIC PANEL
Anion gap: 12 (ref 5–15)
BUN: 28 mg/dL — ABNORMAL HIGH (ref 8–23)
CO2: 20 mmol/L — ABNORMAL LOW (ref 22–32)
Calcium: 9.5 mg/dL (ref 8.9–10.3)
Chloride: 105 mmol/L (ref 98–111)
Creatinine, Ser: 1.49 mg/dL — ABNORMAL HIGH (ref 0.61–1.24)
GFR, Estimated: 51 mL/min — ABNORMAL LOW (ref >60)
Glucose, Bld: 180 mg/dL — ABNORMAL HIGH (ref 70–99)
Potassium: 3.5 mmol/L (ref 3.5–5.1)
Sodium: 137 mmol/L (ref 135–145)

## 2021-04-20 LAB — URINALYSIS, ROUTINE W REFLEX MICROSCOPIC
Bilirubin Urine: NEGATIVE
Glucose, UA: NEGATIVE mg/dL
Hgb urine dipstick: NEGATIVE
Ketones, ur: 20 mg/dL — AB
Leukocytes,Ua: NEGATIVE
Nitrite: NEGATIVE
Protein, ur: NEGATIVE mg/dL
Specific Gravity, Urine: 1.018 (ref 1.005–1.030)
pH: 6 (ref 5.0–8.0)

## 2021-04-20 LAB — CBC WITH DIFFERENTIAL/PLATELET
Abs Immature Granulocytes: 0.05 10*3/uL (ref 0.00–0.07)
Basophils Absolute: 0.1 10*3/uL (ref 0.0–0.1)
Basophils Relative: 1 %
Eosinophils Absolute: 0 10*3/uL (ref 0.0–0.5)
Eosinophils Relative: 0 %
HCT: 44.2 % (ref 39.0–52.0)
Hemoglobin: 15.3 g/dL (ref 13.0–17.0)
Immature Granulocytes: 1 %
Lymphocytes Relative: 8 %
Lymphs Abs: 0.8 10*3/uL (ref 0.7–4.0)
MCH: 29.5 pg (ref 26.0–34.0)
MCHC: 34.6 g/dL (ref 30.0–36.0)
MCV: 85.2 fL (ref 80.0–100.0)
Monocytes Absolute: 0.6 10*3/uL (ref 0.1–1.0)
Monocytes Relative: 6 %
Neutro Abs: 8.5 10*3/uL — ABNORMAL HIGH (ref 1.7–7.7)
Neutrophils Relative %: 84 %
Platelets: 197 10*3/uL (ref 150–400)
RBC: 5.19 MIL/uL (ref 4.22–5.81)
RDW: 13 % (ref 11.5–15.5)
WBC: 9.9 10*3/uL (ref 4.0–10.5)
nRBC: 0 % (ref 0.0–0.2)

## 2021-04-20 MED ORDER — KETOROLAC TROMETHAMINE 30 MG/ML IJ SOLN
15.0000 mg | Freq: Once | INTRAMUSCULAR | Status: AC
  Administered 2021-04-20: 15 mg via INTRAVENOUS
  Filled 2021-04-20: qty 1

## 2021-04-20 MED ORDER — CEPHALEXIN 500 MG PO CAPS: 500.0000 mg | ORAL_CAPSULE | Freq: Two times a day (BID) | ORAL | 0 refills | Status: DC

## 2021-04-20 MED ORDER — OXYCODONE-ACETAMINOPHEN 5-325 MG PO TABS: 1.0000 | ORAL_TABLET | ORAL | 0 refills | Status: DC | PRN

## 2021-04-20 MED ORDER — HYDROMORPHONE HCL 1 MG/ML IJ SOLN
1.0000 mg | Freq: Once | INTRAMUSCULAR | Status: AC
  Administered 2021-04-20: 1 mg via INTRAVENOUS
  Filled 2021-04-20: qty 1

## 2021-04-20 MED ORDER — TAMSULOSIN HCL 0.4 MG PO CAPS: 0.4000 mg | ORAL_CAPSULE | Freq: Every day | ORAL | 0 refills | Status: DC

## 2021-04-20 MED ORDER — ONDANSETRON HCL 4 MG/2ML IJ SOLN
4.0000 mg | Freq: Once | INTRAMUSCULAR | Status: AC
  Administered 2021-04-20: 4 mg via INTRAVENOUS
  Filled 2021-04-20: qty 2

## 2021-04-20 MED ORDER — OXYCODONE-ACETAMINOPHEN 5-325 MG PO TABS: 1.0000 | ORAL_TABLET | Freq: Four times a day (QID) | ORAL | 0 refills | Status: DC | PRN

## 2021-04-20 NOTE — ED Notes (Signed)
Urine strainer given at discharge ?

## 2021-04-20 NOTE — ED Notes (Signed)
Patient transported to CT 

## 2021-04-20 NOTE — ED Provider Notes (Signed)
?[facility] EMERGENCY DEPARTMENT ?Provider Note ? ? ?CSN: [id number] ?Arrival date & time: 04/20/21  0327 ? ?  ? ?History ? ?Chief Complaint  ?Patient presents with  ? Flank Pain  ? ? ?[name] is a 71 y.o. male. ? ?Patient presents to the emergency department for evaluation of flank pain.  Patient reports that he developed left-sided flank pain around 8 PM.  Patient has had persistent, aching, sharp pain in the left back with some radiation towards the groin area.  Patient reports that he had kidney stones approximately 20 years ago and this feels similar.  He has had some urinary urgency and small urine volume. ? ? ?  ? ?Home Medications ?Prior to Admission medications   ?Medication Sig Start Date End Date Taking? Authorizing Provider  ?cephALEXin (KEFLEX) 500 MG capsule Take 1 capsule (500 mg total) by mouth 2 (two) times daily. 04/20/21  Yes Sherod Cisse, Canary Brimhristopher J, MD  ?oxyCODONE-acetaminophen (PERCOCET) 5-325 MG tablet Take 1-2 tablets by mouth every 4 (four) hours as needed. 04/20/21  Yes Maryland Luppino, Canary Brimhristopher J, MD  ?oxyCODONE-acetaminophen (PERCOCET/ROXICET) 5-325 MG tablet Take 1 tablet by mouth every 6 (six) hours as needed for severe pain. 04/20/21  Yes Conrado Nance, Canary Brimhristopher J, MD  ?tamsulosin (FLOMAX) 0.4 MG CAPS capsule Take 1 capsule (0.4 mg total) by mouth daily. 04/20/21  Yes Monserratt Knezevic, Canary Brimhristopher J, MD  ?acetaminophen (TYLENOL) 325 MG tablet Take 1-2 tablets (325-650 mg total) by mouth every 6 (six) hours as needed for mild pain (pain score 1-3 or temp > 100.5). 06/30/19   Jetty PeeksPetrarca, Brian D, PA-C  ?amLODipine (NORVASC) 10 MG tablet Take 1 tablet (10 mg total) by mouth daily. 08/03/20   Mechele ClaudeStacks, Warren, MD  ?celecoxib (CELEBREX) 200 MG capsule Take 1 capsule (200 mg total) by mouth daily. With food 02/12/21   Mechele ClaudeStacks, Warren, MD  ?[facility] ASPIRIN ADULT LOW DOSE 81 MG chewable tablet CHEW 1 TABLET (81 MG TOTAL) BY MOUTH 2 (TWO) TIMES DAILY. 06/06/20   Mechele ClaudeStacks, Warren, MD  ?hydrochlorothiazide (MICROZIDE) 12.5 MG  capsule Take 1 capsule (12.5 mg total) by mouth daily. 08/03/20   Mechele ClaudeStacks, Warren, MD  ?lisinopril (ZESTRIL) 10 MG tablet Take 1 tablet (10 mg total) by mouth daily. 08/03/20   Mechele ClaudeStacks, Warren, MD  ?   ? ?Allergies    ?Patient has no known allergies.   ? ?Review of Systems   ?Review of Systems  ?Genitourinary:  Positive for decreased urine volume and flank pain.  ? ?Physical Exam ?Updated Vital Signs ?BP 115/69   Pulse 72   Temp 97.7 ?F (36.5 ?C) (Oral)   Resp 16   Ht 5\' 11"  (1.803 m)   Wt 88.9 kg   SpO2 95%   BMI 27.34 kg/m?  ?Physical Exam ?Vitals and nursing note reviewed.  ?Constitutional:   ?   General: He is not in acute distress. ?   Appearance: He is well-developed.  ?HENT:  ?   Head: Normocephalic and atraumatic.  ?   Mouth/Throat:  ?   Mouth: Mucous membranes are moist.  ?Eyes:  ?   General: Vision grossly intact. Gaze aligned appropriately.  ?   Extraocular Movements: Extraocular movements intact.  ?   Conjunctiva/sclera: Conjunctivae normal.  ?Cardiovascular:  ?   Rate and Rhythm: Normal rate and regular rhythm.  ?   Pulses: Normal pulses.  ?   Heart sounds: Normal heart sounds, S1 normal and S2 normal. No murmur heard. ?  No friction rub. No gallop.  ?Pulmonary:  ?  Effort: Pulmonary effort is normal. No respiratory distress.  ?   Breath sounds: Normal breath sounds.  ?Abdominal:  ?   Palpations: Abdomen is soft.  ?   Tenderness: There is no abdominal tenderness. There is no guarding or rebound.  ?   Hernia: No hernia is present.  ?Musculoskeletal:     ?   General: No swelling.  ?   Cervical back: Full passive range of motion without pain, normal range of motion and neck supple. No pain with movement, spinous process tenderness or muscular tenderness. Normal range of motion.  ?   Right lower leg: No edema.  ?   Left lower leg: No edema.  ?Skin: ?   General: Skin is warm and dry.  ?   Capillary Refill: Capillary refill takes less than 2 seconds.  ?   Findings: No ecchymosis, erythema, lesion or  wound.  ?Neurological:  ?   Mental Status: He is alert and oriented to person, place, and time.  ?   GCS: GCS eye subscore is 4. GCS verbal subscore is 5. GCS motor subscore is 6.  ?   Cranial Nerves: Cranial nerves 2-12 are intact.  ?   Sensory: Sensation is intact.  ?   Motor: Motor function is intact. No weakness or abnormal muscle tone.  ?   Coordination: Coordination is intact.  ?Psychiatric:     ?   Mood and Affect: Mood normal.     ?   Speech: Speech normal.     ?   Behavior: Behavior normal.  ? ? ?ED Results / Procedures / Treatments   ?Labs ?(all labs ordered are listed, but only abnormal results are displayed) ?Labs Reviewed  ?CBC WITH DIFFERENTIAL/PLATELET - Abnormal; Notable for the following components:  ?    Result Value  ? Neutro Abs 8.5 (*)   ? All other components within normal limits  ?BASIC METABOLIC PANEL - Abnormal; Notable for the following components:  ? CO2 20 (*)   ? Glucose, Bld 180 (*)   ? BUN 28 (*)   ? Creatinine, Ser 1.49 (*)   ? GFR, Estimated 51 (*)   ? All other components within normal limits  ?URINALYSIS, ROUTINE W REFLEX MICROSCOPIC - Abnormal; Notable for the following components:  ? Ketones, ur 20 (*)   ? All other components within normal limits  ? ? ?EKG ?None ? ?Radiology ?CT RENAL STONE STUDY ? ?Result Date: 04/20/2021 ?CLINICAL DATA:  71 year old male with flank pain since 2000 hours. Pain radiating to the left lower quadrant. EXAM: CT ABDOMEN AND PELVIS WITHOUT CONTRAST TECHNIQUE: Multidetector CT imaging of the abdomen and pelvis was performed following the standard protocol without IV contrast. RADIATION DOSE REDUCTION: This exam was performed according to the departmental dose-optimization program which includes automated exposure control, adjustment of the mA and/or kV according to patient size and/or use of iterative reconstruction technique. COMPARISON:  CT Abdomen and Pelvis 12/11/2017 and earlier. FINDINGS: Lower chest: Stable cardiac size with no pericardial or  pleural effusion. Mild chronic lower lobe scarring greater on the left. Hepatobiliary: Negative noncontrast liver and gallbladder. Pancreas: Negative. Spleen: Stable since 2019. Mildly lobulated low-density areas in the superior pole of the spleen, with some associated dystrophic calcification (series 2, image 14), appear stable and benign and have been present since at least 2007 - described as benign and cystic on abdomen MRI report 08/06/2005. Adrenals/Urinary Tract: Normal adrenal glands. Right kidney is stable since 2019 and negative with a small exophytic midpole  cyst. Negative right ureter. Obstructed and inflamed left kidney with left pararenal space stranding and fluid. Mild to moderate left hydronephrosis and left hydroureter. The ureter remains dilated into the pelvis where streak artifact from hip arthroplasty is present. And there are numerous pelvic phleboliths. However, on coronal images a distal 3 mm obstructing calculus is identified (series 5, images 72 and 73) just upstream of the left UVJ. The bladder is decompressed. No other urinary calculus. Stomach/Bowel: Severe chronic diverticulosis of the redundant sigmoid colon. And moderate diverticulosis of the descending colon. No active inflammation identified. Negative right colon. Negative appendix (series 2, image 56). Cecum is on a lax mesentery. No dilated small bowel. Decompressed stomach and duodenum. No free air or free fluid. Vascular/Lymphatic: Aortoiliac calcified atherosclerosis. Normal caliber abdominal aorta. No lymphadenopathy identified. Reproductive: Negative. Other: No pelvic free fluid is evident. Musculoskeletal: Transitional lumbosacral anatomy and lumbar spine degeneration. 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 No acute osseous abnormality identified. IMPRESSION: 1. Acute obstructive uropathy on the left with forniceal rupture. Obstructing 3 mm distal left ureteral calculus just proximal to the left UVJ in the pelvis. No  other urinary calculus identified. 2. No other acute or inflammatory process. Extensive diverticulosis of the distal colon. Aortic Atherosclerosis (ICD10-I70.0). Electronically Signed   By: Odessa Fleming M.D.   On: 04/20/2021 05:1

## 2021-04-20 NOTE — ED Triage Notes (Signed)
Pt c/o left flank pain since about 8pm last night. States pain radiates around to LLQ. Pt has hx of kidney stones and states pain feels similar.  ?

## 2021-04-20 NOTE — ED Notes (Addendum)
ED Provider at bedside. ?Urinal placed at bedside ?

## 2021-04-23 MED FILL — Oxycodone w/ Acetaminophen Tab 5-325 MG: ORAL | Qty: 6 | Status: AC

## 2021-04-25 ENCOUNTER — Encounter: Payer: Self-pay | Admitting: *Deleted

## 2021-07-08 ENCOUNTER — Other Ambulatory Visit: Payer: Self-pay

## 2021-07-08 ENCOUNTER — Emergency Department (HOSPITAL_BASED_OUTPATIENT_CLINIC_OR_DEPARTMENT_OTHER)
Admission: EM | Admit: 2021-07-08 | Discharge: 2021-07-08 | Disposition: A | Discharge status: Home / Self Care | Payer: 59 | Attending: Emergency Medicine | Admitting: Emergency Medicine

## 2021-07-08 ENCOUNTER — Encounter (HOSPITAL_BASED_OUTPATIENT_CLINIC_OR_DEPARTMENT_OTHER): Payer: Self-pay

## 2021-07-08 ENCOUNTER — Emergency Department (HOSPITAL_BASED_OUTPATIENT_CLINIC_OR_DEPARTMENT_OTHER): Payer: 59

## 2021-07-08 DIAGNOSIS — Z79899 Other long term (current) drug therapy: Secondary | ICD-10-CM | POA: Insufficient documentation

## 2021-07-08 DIAGNOSIS — Z20822 Contact with and (suspected) exposure to covid-19: Secondary | ICD-10-CM | POA: Diagnosis not present

## 2021-07-08 DIAGNOSIS — Z7901 Long term (current) use of anticoagulants: Secondary | ICD-10-CM | POA: Insufficient documentation

## 2021-07-08 DIAGNOSIS — R519 Headache, unspecified: Secondary | ICD-10-CM | POA: Insufficient documentation

## 2021-07-08 DIAGNOSIS — I4891 Unspecified atrial fibrillation: Secondary | ICD-10-CM | POA: Diagnosis not present

## 2021-07-08 DIAGNOSIS — I10 Essential (primary) hypertension: Secondary | ICD-10-CM | POA: Diagnosis not present

## 2021-07-08 LAB — COMPREHENSIVE METABOLIC PANEL
ALT: 25 U/L (ref 0–44)
AST: 25 U/L (ref 15–41)
Albumin: 4.8 g/dL (ref 3.5–5.0)
Alkaline Phosphatase: 69 U/L (ref 38–126)
Anion gap: 16 — ABNORMAL HIGH (ref 5–15)
BUN: 20 mg/dL (ref 8–23)
CO2: 21 mmol/L — ABNORMAL LOW (ref 22–32)
Calcium: 10 mg/dL (ref 8.9–10.3)
Chloride: 102 mmol/L (ref 98–111)
Creatinine, Ser: 1.13 mg/dL (ref 0.61–1.24)
GFR, Estimated: >60 mL/min (ref >60)
Glucose, Bld: 230 mg/dL — ABNORMAL HIGH (ref 70–99)
Potassium: 3.9 mmol/L (ref 3.5–5.1)
Sodium: 139 mmol/L (ref 135–145)
Total Bilirubin: 1.2 mg/dL (ref 0.3–1.2)
Total Protein: 7.9 g/dL (ref 6.5–8.1)

## 2021-07-08 LAB — CBC
HCT: 44.7 % (ref 39.0–52.0)
Hemoglobin: 15.3 g/dL (ref 13.0–17.0)
MCH: 29.4 pg (ref 26.0–34.0)
MCHC: 34.2 g/dL (ref 30.0–36.0)
MCV: 85.8 fL (ref 80.0–100.0)
Platelets: 217 10*3/uL (ref 150–400)
RBC: 5.21 MIL/uL (ref 4.22–5.81)
RDW: 12.9 % (ref 11.5–15.5)
WBC: 10.5 10*3/uL (ref 4.0–10.5)
nRBC: 0 % (ref 0.0–0.2)

## 2021-07-08 LAB — SARS CORONAVIRUS 2 BY RT PCR: SARS Coronavirus 2 by RT PCR: NEGATIVE

## 2021-07-08 MED ORDER — SODIUM CHLORIDE 0.9 % IV BOLUS
1000.0000 mL | Freq: Once | INTRAVENOUS | Status: AC
  Administered 2021-07-08: 1000 mL via INTRAVENOUS

## 2021-07-08 MED ORDER — TETRACAINE HCL 0.5 % OP SOLN
2.0000 [drp] | Freq: Once | OPHTHALMIC | Status: AC
Start: 2021-07-08 — End: 2021-07-08
  Administered 2021-07-08: 2 [drp] via OPHTHALMIC
  Filled 2021-07-08: qty 4

## 2021-07-08 MED ORDER — DIPHENHYDRAMINE HCL 50 MG/ML IJ SOLN
25.0000 mg | Freq: Once | INTRAMUSCULAR | Status: AC
Start: 2021-07-08 — End: 2021-07-08
  Administered 2021-07-08: 25 mg via INTRAVENOUS
  Filled 2021-07-08: qty 1

## 2021-07-08 MED ORDER — KETOROLAC TROMETHAMINE 15 MG/ML IJ SOLN
15.0000 mg | Freq: Once | INTRAMUSCULAR | Status: AC
  Administered 2021-07-08: 15 mg via INTRAVENOUS
  Filled 2021-07-08: qty 1

## 2021-07-08 MED ORDER — APIXABAN 5 MG PO TABS: 5.0000 mg | ORAL_TABLET | Freq: Two times a day (BID) | ORAL | 0 refills | Status: DC

## 2021-07-08 MED ORDER — METOCLOPRAMIDE HCL 5 MG/ML IJ SOLN
10.0000 mg | Freq: Once | INTRAMUSCULAR | Status: AC
  Administered 2021-07-08: 10 mg via INTRAVENOUS
  Filled 2021-07-08: qty 2

## 2021-07-08 NOTE — Discharge Instructions (Addendum)
Follow-up with your primary care doctor within the next few days regarding your headache.  Your glucose was also elevated and this needs to be rechecked by your primary care doctor.  Stop taking your Celebrex and aspirin while you are taking Eliquis.  You can take Tylenol for pain.  Make an appointment to follow-up with your cardiologist regarding your atrial fibrillation.  Return to the emergency room if you have any worsening symptoms.

## 2021-07-08 NOTE — ED Provider Notes (Signed)
[facility] EMERGENCY DEPT Provider Note   CSN: [id number] Arrival date & time: 07/08/21  0827     History  Chief Complaint  Patient presents with   Headache    [name] is a 71 y.o. male.  Patient is a 71 year old male who presents with a headache.  He has a history of hypertension.  He states 2 days ago he woke up with a headache.  Its on his left side.  It starts in his sinuses and goes around the left side of his head down towards his left neck.  He said initially it did not hurt in his eye but now he has some eye pain.  No blurry vision.  He has a little bit of watery discharge from his left eye.  Is been progressively worsening over the last 2 days.  It got worse last night and he started vomiting.  He has not really been able to keep anything down since yesterday.  He does have a history of migraines but says this feels different.  His migraines are usually all over and usually preceded by an aura.  He does not have any pain radiating down the back of his neck.  No numbness or weakness to his extremities.  No vision changes or speech deficits.  No difficulty with his balance.  No recent head trauma.  No fevers or congestion.  The day before he had done some mowing and there was a lot of dust.  His left nostril was more closed and he thought the headache may be related to that but it has not gotten better.  No pain with chewing.      Home Medications Prior to Admission medications   Medication Sig Start Date End Date Taking? Authorizing Provider  apixaban (ELIQUIS) 5 MG TABS tablet Take 1 tablet (5 mg total) by mouth 2 (two) times daily. 07/08/21 08/07/21 Yes Rolan Bucco, MD  acetaminophen (TYLENOL) 325 MG tablet Take 1-2 tablets (325-650 mg total) by mouth every 6 (six) hours as needed for mild pain (pain score 1-3 or temp > 100.5). 06/30/19   Jetty Peeks, PA-C  amLODipine (NORVASC) 10 MG tablet Take 1 tablet (10 mg total) by mouth daily. 08/03/20   Mechele Claude, MD  cephALEXin (KEFLEX) 500 MG capsule Take 1 capsule (500 mg total) by mouth 2 (two) times daily. 04/20/21   Gilda Crease, MD  hydrochlorothiazide (MICROZIDE) 12.5 MG capsule Take 1 capsule (12.5 mg total) by mouth daily. 08/03/20   Mechele Claude, MD  lisinopril (ZESTRIL) 10 MG tablet Take 1 tablet (10 mg total) by mouth daily. 08/03/20   Mechele Claude, MD  oxyCODONE-acetaminophen (PERCOCET) 5-325 MG tablet Take 1-2 tablets by mouth every 4 (four) hours as needed. 04/20/21   Gilda Crease, MD  oxyCODONE-acetaminophen (PERCOCET/ROXICET) 5-325 MG tablet Take 1 tablet by mouth every 6 (six) hours as needed for severe pain. 04/20/21   Gilda Crease, MD  tamsulosin (FLOMAX) 0.4 MG CAPS capsule Take 1 capsule (0.4 mg total) by mouth daily. 04/20/21   Gilda Crease, MD      Allergies    Patient has no known allergies.    Review of Systems   Review of Systems  Constitutional:  Negative for chills, diaphoresis, fatigue and fever.  HENT:  Negative for congestion, rhinorrhea and sneezing.   Eyes:  Positive for pain and discharge. Negative for photophobia, redness and visual disturbance.  Respiratory:  Negative for cough, chest tightness and shortness of breath.  Cardiovascular:  Negative for chest pain and leg swelling.  Gastrointestinal:  Positive for nausea and vomiting. Negative for abdominal pain, blood in stool and diarrhea.  Genitourinary:  Negative for difficulty urinating, flank pain, frequency and hematuria.  Musculoskeletal:  Negative for arthralgias and back pain.  Skin:  Negative for rash.  Neurological:  Positive for headaches. Negative for dizziness, speech difficulty, weakness and numbness.   Physical Exam Updated Vital Signs BP 117/75 (BP Location: Right Arm)   Pulse 89   Temp 97.9 F (36.6 C) (Oral)   Resp 17   Ht 5\' 11"  (1.803 m)   Wt 90.7 kg   SpO2 97%   BMI 27.89 kg/m  Physical Exam Constitutional:      Appearance: He is  well-developed.  HENT:     Head: Normocephalic and atraumatic.     Comments: No rashes noted.  No redness of the eye.  There is some mild watery discharge.  No swelling of the left side of the face.  No direct tenderness over the sinuses.  No direct pain over the temporal area as compared to the rest of the left side of the face Eyes:     Pupils: Pupils are equal, round, and reactive to light.     Comments: Eye pressure with tonapen 19 on left  Cardiovascular:     Rate and Rhythm: Normal rate and regular rhythm.     Heart sounds: Normal heart sounds.  Pulmonary:     Effort: Pulmonary effort is normal. No respiratory distress.     Breath sounds: Normal breath sounds. No wheezing or rales.  Chest:     Chest wall: No tenderness.  Abdominal:     General: Bowel sounds are normal.     Palpations: Abdomen is soft.     Tenderness: There is no abdominal tenderness. There is no guarding or rebound.  Musculoskeletal:        General: Normal range of motion.     Cervical back: Normal range of motion and neck supple.  Lymphadenopathy:     Cervical: No cervical adenopathy.  Skin:    General: Skin is warm and dry.     Findings: No rash.  Neurological:     Mental Status: He is alert and oriented to person, place, and time.     Comments: Motor 5/5 all extremities Sensation grossly intact to LT all extremities Finger to Nose intact, no pronator drift CN II-XII grossly intact      ED Results / Procedures / Treatments   Labs (all labs ordered are listed, but only abnormal results are displayed) Labs Reviewed  COMPREHENSIVE METABOLIC PANEL - Abnormal; Notable for the following components:      Result Value   CO2 21 (*)    Glucose, Bld 230 (*)    Anion gap 16 (*)    All other components within normal limits  SARS CORONAVIRUS 2 BY RT PCR  CBC    EKG None  Radiology CT Head Wo Contrast  Result Date: 07/08/2021 CLINICAL DATA:  71 year old male with acute headache and vomiting. EXAM: CT  HEAD WITHOUT CONTRAST TECHNIQUE: Contiguous axial images were obtained from the base of the skull through the vertex without intravenous contrast. RADIATION DOSE REDUCTION: This exam was performed according to the departmental dose-optimization program which includes automated exposure control, adjustment of the mA and/or kV according to patient size and/or use of iterative reconstruction technique. COMPARISON:  None Available. FINDINGS: Brain: No evidence of acute infarction, hemorrhage, hydrocephalus, extra-axial collection  or mass lesion/mass effect. Mild probable chronic periventricular white matter small vessel ischemic changes noted. Vascular: Carotid and vertebral atherosclerotic calcifications are noted. Skull: Normal. Negative for fracture or focal lesion. Sinuses/Orbits: Unremarkable Other: None. IMPRESSION: 1. No evidence of acute intracranial abnormality. 2. Mild probable chronic periventricular white matter small vessel ischemic changes. Electronically Signed   By: Harmon Pier M.D.   On: 07/08/2021 10:45    Procedures Procedures    Medications Ordered in ED Medications  metoCLOPramide (REGLAN) injection 10 mg (10 mg Intravenous Given 07/08/21 1010)  ketorolac (TORADOL) 15 MG/ML injection 15 mg (15 mg Intravenous Given 07/08/21 1010)  sodium chloride 0.9 % bolus 1,000 mL (1,000 mLs Intravenous New Bag/Given 07/08/21 1010)  tetracaine (PONTOCAINE) 0.5 % ophthalmic solution 2 drop (2 drops Left Eye Given by Other 07/08/21 1055)  diphenhydrAMINE (BENADRYL) injection 25 mg (25 mg Intravenous Given 07/08/21 1131)    ED Course/ Medical Decision Making/ A&P                           Medical Decision Making Problems Addressed: Acute nonintractable headache, unspecified headache type: undiagnosed new problem with uncertain prognosis New onset atrial fibrillation (HCC): undiagnosed new problem with uncertain prognosis  Amount and/or Complexity of Data Reviewed External Data Reviewed: notes. Labs:  ordered. Decision-making details documented in ED Course. Radiology: ordered. Decision-making details documented in ED Course. ECG/medicine tests: ordered and independent interpretation performed. Decision-making details documented in ED Course.  Risk Prescription drug management. Decision regarding hospitalization.   Patient is a 71 year old male who presents with a headache.  He has had a history of migraines but he is recently over the last 3 days had a worsening headache on his left side which is different than his normal migraines.  It was not sudden onset.  He does not have associated neck pain or other suggestions of subarachnoid hemorrhage.  No signs of meningitis.  No neurologic deficits.  He has left-sided facial pain as well but there is no rash or suggestions of shingles.  He had a CT scan which shows no evidence of intracranial hemorrhage, mass or other abnormality.  He does not have any focal neurologic deficits.  His symptoms have improved with treatment of his headache.  He says he only has a minimal headache currently.  Of note, he was noted to be in atrial fibrillation.  His rate is controlled.  EKG confirmed this.  This was interpreted by me.  He does not have any known history of A-fib.  I reviewed his chart and he previously saw cardiologist Dr. 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  I do not see any prior records of atrial fibrillation.  He does not have a sensation that he is in A-fib.  He was in it the entire time he was in the emergency department on the monitor.  I had a discussion with the patient and his family about risk of embolic events and potential use of blood thinners.  We discussed the risk and benefits of blood thinners.  He is opting to start blood thinners.  I gave him a prescription for Eliquis and gave him precautions for use of blood thinners.  He will stop his aspirin and Celebrex for now.  He will  follow-up with Dr. Wyline Mood in [place].  I also encouraged him to have close follow-up with his PCP regarding his headache.  Return precautions were given.  Final Clinical Impression(s) / ED Diagnoses Final diagnoses:  New onset atrial fibrillation (HCC)  Acute nonintractable headache, unspecified headache type    Rx / DC Orders ED Discharge Orders          Ordered    apixaban (ELIQUIS) 5 MG TABS tablet  2 times daily        07/08/21 1327              Rolan Bucco, MD 07/08/21 1518

## 2021-07-08 NOTE — ED Notes (Signed)
Tonopen and Tetracaine at bedside.

## 2021-07-08 NOTE — ED Triage Notes (Signed)
Pt. States he has been vomiting all night. States his headache 10/10. States he took Motrin and oxycodone and vomited them back up. Denies double vision/ blurred vision.

## 2021-07-10 ENCOUNTER — Ambulatory Visit: Payer: 59 | Admitting: Family Medicine

## 2021-07-10 ENCOUNTER — Telehealth: Payer: Self-pay | Admitting: *Deleted

## 2021-07-10 ENCOUNTER — Encounter: Payer: Self-pay | Admitting: Family Medicine

## 2021-07-10 VITALS — BP 135/83 | HR 80 | Temp 98.1°F | Ht 71.0 in | Wt 192.0 lb

## 2021-07-10 DIAGNOSIS — G43911 Migraine, unspecified, intractable, with status migrainosus: Secondary | ICD-10-CM

## 2021-07-10 DIAGNOSIS — I480 Paroxysmal atrial fibrillation: Secondary | ICD-10-CM

## 2021-07-10 MED ORDER — METOPROLOL SUCCINATE ER 50 MG PO TB24: 50.0000 mg | ORAL_TABLET | Freq: Every day | ORAL | 2 refills | Status: DC

## 2021-07-10 MED ORDER — BUTALBITAL-APAP-CAFFEINE 50-300-40 MG PO CAPS: 1.0000 | ORAL_CAPSULE | Freq: Four times a day (QID) | ORAL | 2 refills | Status: DC | PRN

## 2021-07-10 MED ORDER — KETOROLAC TROMETHAMINE 60 MG/2ML IM SOLN
60.0000 mg | Freq: Once | INTRAMUSCULAR | Status: AC
  Administered 2021-07-10: 60 mg via INTRAMUSCULAR

## 2021-07-10 NOTE — Telephone Encounter (Signed)
Key: [id number] - PA Case ID: [id number] - Rx #: [id number]  Drug  Butalbital-APAP-Caffeine 50-300-40MG  capsules  Form  [employer] Electronic PA Form [phone] NCPDP)      Original Claim Info  77 +USE GENERIC NSAIDS OR [place] [id number] DRUG REQUIRES PRIOR AUTHORIZATION   Sent to Plan today

## 2021-07-10 NOTE — Progress Notes (Signed)
Subjective:  Patient ID: [name], male    DOB: 11-17-1950  Age: 71 y.o. MRN: [id number]  CC: ER FOLLOW UP, Atrial Fibrillation, and Headache   HPI [name] presents for left temple HA. Onset 4 days ago. 5/10. Next day was pounding. Rested all day. Kept pounding. Took 2 oxy at 2 AM. Pain at that time was 10/10.Vomited them back up 20 minutes later. Went to E.D. Had CT scan. Reviewed with pt. And shows no bleed, tumor or CVA. Vision not impacted by HA. It remains entirely unilateral to the left.  EKG showed A.Fib. Eye pressure was good.   Still nauseated. Appetite decreased.   Had migraines in the past.Relief with rest. Went away when he started hypertension treatment several years ago.   A fib first noted at E.D. 3 days ago. 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   No rate control given at E.D. Eliquis ordered but pt didn't realize it had been sent to the pharmacy, Chart review today showed it had been so he plans to pick it up after finishing here.      07/10/2021    9:51 AM 02/12/2021    4:10 PM 08/03/2020   10:26 AM  Depression screen PHQ 2/9  Decreased Interest 0 0 0  Down, Depressed, Hopeless 1 0 0  PHQ - 2 Score 1 0 0  Altered sleeping 1    Tired, decreased energy 1    Change in appetite 1    Feeling bad or failure about yourself  0    Trouble concentrating 0    Moving slowly or fidgety/restless 0    Suicidal thoughts 0    PHQ-9 Score 4    Difficult doing work/chores Somewhat difficult      History Aleph has a past medical history of Hypertension.   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   His family history is not on file.He reports that he has never smoked. He has never used smokeless tobacco. He reports that he does not drink alcohol and does not use drugs.    ROS Review of Systems  Constitutional:  Negative for fever.  Respiratory:  Negative  for shortness of breath.   Cardiovascular:  Negative for chest pain.  Musculoskeletal:  Negative for arthralgias.  Skin:  Negative for rash.  Neurological:  Positive for headaches. Negative for seizures, syncope, speech difficulty and light-headedness.  Psychiatric/Behavioral:  The patient is nervous/anxious.    Objective:  BP 135/83   Pulse 80   Temp 98.1 F (36.7 C)   Ht 5\' 11"  (1.803 m)   Wt 192 lb (87.1 kg)   SpO2 97%   BMI 26.78 kg/m   BP Readings from Last 3 Encounters:  07/10/21 135/83  07/08/21 117/75  04/20/21 120/75    Wt Readings from Last 3 Encounters:  07/10/21 192 lb (87.1 kg)  07/08/21 200 lb (90.7 kg)  04/20/21 196 lb (88.9 kg)     Physical Exam Vitals reviewed.  Constitutional:      Appearance: He is well-developed.  HENT:     Head: Normocephalic and atraumatic.     Right Ear: External ear normal.     Left Ear: External ear normal.     Mouth/Throat:     Pharynx: No oropharyngeal exudate or posterior oropharyngeal erythema.  Eyes:     Pupils: Pupils are equal, round, and reactive to light.  Cardiovascular:  Rate and Rhythm: Normal rate. Rhythm irregularly irregular.     Heart sounds: Normal heart sounds. No murmur heard.   No friction rub. No gallop.  Pulmonary:     Effort: No respiratory distress.     Breath sounds: Normal breath sounds.  Musculoskeletal:     Cervical back: Normal range of motion and neck supple.     Right lower leg: No edema.     Left lower leg: No edema.  Neurological:     Mental Status: He is alert and oriented to person, place, and time.  Psychiatric:        Attention and Perception: Attention normal.        Mood and Affect: Mood is anxious.        Speech: Speech is rapid and pressured.        Cognition and Memory: Cognition normal.      Assessment & Plan:   Aurther Lofterry was seen today for er follow up, atrial fibrillation and headache.  Diagnoses and all orders for this visit:  Paroxysmal atrial fibrillation  (HCC) -     Ambulatory referral to Cardiology  Intractable migraine with status migrainosus, unspecified migraine type -     ketorolac (TORADOL) injection 60 mg  Other orders -     metoprolol succinate (TOPROL-XL) 50 MG 24 hr tablet; Take 1 tablet (50 mg total) by mouth daily. For blood pressure control -     Butalbital-APAP-Caffeine 50-300-40 MG CAPS; Take 1 tablet by mouth every 6 (six) hours as needed.       I have discontinued Aurther Lofterry Stith's hydrochlorothiazide, oxyCODONE-acetaminophen, oxyCODONE-acetaminophen, and cephALEXin. I am also having him start on metoprolol succinate and Butalbital-APAP-Caffeine. Additionally, I am having him maintain his acetaminophen, amLODipine, lisinopril, tamsulosin, and apixaban. We administered ketorolac.  Allergies as of 07/10/2021   No Known Allergies      Medication List        Accurate as of July 10, 2021  7:47 PM. If you have any questions, ask your nurse or doctor.          STOP taking these medications    cephALEXin 500 MG capsule Commonly known as: KEFLEX Stopped by: Mechele ClaudeWarren Geo Slone, MD   hydrochlorothiazide 12.5 MG capsule Commonly known as: MICROZIDE Stopped by: Mechele ClaudeWarren Mikylah Ackroyd, MD   oxyCODONE-acetaminophen 5-325 MG tablet Commonly known as: PERCOCET/ROXICET Stopped by: Mechele ClaudeWarren Irma Delancey, MD       TAKE these medications    acetaminophen 325 MG tablet Commonly known as: TYLENOL Take 1-2 tablets (325-650 mg total) by mouth every 6 (six) hours as needed for mild pain (pain score 1-3 or temp > 100.5).   amLODipine 10 MG tablet Commonly known as: NORVASC Take 1 tablet (10 mg total) by mouth daily.   apixaban 5 MG Tabs tablet Commonly known as: ELIQUIS Take 1 tablet (5 mg total) by mouth 2 (two) times daily.   Butalbital-APAP-Caffeine 50-300-40 MG Caps Take 1 tablet by mouth every 6 (six) hours as needed. Started by: Mechele ClaudeWarren Undrea Archbold, MD   lisinopril 10 MG tablet Commonly known as: ZESTRIL Take 1 tablet (10 mg total) by  mouth daily.   metoprolol succinate 50 MG 24 hr tablet Commonly known as: TOPROL-XL Take 1 tablet (50 mg total) by mouth daily. For blood pressure control Started by: Mechele ClaudeWarren Kenna Kirn, MD   tamsulosin 0.4 MG Caps capsule Commonly known as: FLOMAX Take 1 capsule (0.4 mg total) by mouth daily.         Follow-up: Return in about 6 weeks (  around 08/21/2021).  Mechele Claude, M.D.

## 2021-07-11 ENCOUNTER — Other Ambulatory Visit: Payer: Self-pay | Admitting: Family Medicine

## 2021-07-11 ENCOUNTER — Ambulatory Visit: Payer: 59 | Admitting: Family Medicine

## 2021-07-11 ENCOUNTER — Telehealth: Payer: Self-pay | Admitting: Family Medicine

## 2021-07-11 MED ORDER — RIVAROXABAN (XARELTO) VTE STARTER PACK (15 & 20 MG): ORAL_TABLET | ORAL | 0 refills | Status: DC

## 2021-07-11 NOTE — Telephone Encounter (Signed)
I sent in xarelto as a substitute. Will have to check with pharmacy to see if it is cheaper. There is no generic equivalent for this condition.

## 2021-07-12 ENCOUNTER — Telehealth: Payer: Self-pay | Admitting: Family Medicine

## 2021-07-12 NOTE — Telephone Encounter (Signed)
Patient has had a headache since Sunday he states he went to ER on Sunday and all they told him was that he had not had a stroke. Patient reports that his eye is swollen and it is very uncomfortable. I adivised patient that if symptoms worsen proceed to ER or Urgent care.   Appt made for 07/13/2021

## 2021-07-12 NOTE — Telephone Encounter (Signed)
PT AWARE  

## 2021-07-13 ENCOUNTER — Encounter: Payer: Self-pay | Admitting: Family Medicine

## 2021-07-13 ENCOUNTER — Ambulatory Visit: Payer: 59 | Admitting: Family Medicine

## 2021-07-13 VITALS — BP 107/73 | HR 111 | Temp 95.5°F | Ht 71.0 in | Wt 188.2 lb

## 2021-07-13 DIAGNOSIS — L03213 Periorbital cellulitis: Secondary | ICD-10-CM

## 2021-07-13 DIAGNOSIS — R110 Nausea: Secondary | ICD-10-CM

## 2021-07-13 MED ORDER — ONDANSETRON 4 MG PO TBDP: 4.0000 mg | ORAL_TABLET | Freq: Three times a day (TID) | ORAL | 0 refills | Status: DC | PRN

## 2021-07-13 MED ORDER — SULFAMETHOXAZOLE-TRIMETHOPRIM 800-160 MG PO TABS: 1.0000 | ORAL_TABLET | Freq: Two times a day (BID) | ORAL | 0 refills | Status: AC

## 2021-07-13 NOTE — Progress Notes (Signed)
Assessment & Plan:  1. Periorbital cellulitis of left eye Education provided on preseptal cellulitis. Encouraged to go to the ER over the weekend if vision becomes distorted or he has pain with eye movements.  - sulfamethoxazole-trimethoprim (BACTRIM DS) 800-160 MG tablet; Take 1 tablet by mouth 2 (two) times daily for 10 days.  Dispense: 20 tablet; Refill: 0  2. Nausea without vomiting - ondansetron (ZOFRAN-ODT) 4 MG disintegrating tablet; Take 1 tablet (4 mg total) by mouth every 8 (eight) hours as needed for nausea or vomiting.  Dispense: 20 tablet; Refill: 0   Follow up plan: Return in about 4 days (around 07/17/2021) for re-check cellulitis.  [name], MSN, APRN, FNP-C [facility]  Subjective:   Patient ID: [name], male    DOB: 08-27-1950, 71 y.o.   MRN: [id number]  HPI: [name] is a 71 y.o. male presenting on 07/13/2021 for Facial Swelling (Left eye swelling x 2 days- painful and draining )  Patient is accompanied by his wife who he is okay with being present.   Patient reports a headache and pain around his left eye that started on Friday. He went to the ER on Sunday where his head CT was negative for acute intracranial abnormality. He was seen by his PCP on Tuesday where it was felt his pain was due to a migraine. He started having swelling and erythema around his left eye on Wednesday that has progressively worsened. He denies pain with eye movement and fever. He is nauseated due to the pain and not eating very much. He was outside mowing the day prior to all of this onset.    ROS: Negative unless specifically indicated above in HPI.   Relevant past medical history reviewed and updated as indicated.   Allergies and medications reviewed and updated.   Current Outpatient Medications:    acetaminophen (TYLENOL) 325 MG tablet, Take 1-2 tablets (325-650 mg total) by mouth every 6 (six) hours as needed for mild pain (pain score 1-3 or temp >  100.5)., Disp:  , Rfl:    amLODipine (NORVASC) 10 MG tablet, Take 1 tablet (10 mg total) by mouth daily., Disp: 90 tablet, Rfl: 3   Butalbital-APAP-Caffeine 50-300-40 MG CAPS, Take 1 tablet by mouth every 6 (six) hours as needed., Disp: 30 capsule, Rfl: 2   lisinopril (ZESTRIL) 10 MG tablet, Take 1 tablet (10 mg total) by mouth daily., Disp: 90 tablet, Rfl: 3   metoprolol succinate (TOPROL-XL) 50 MG 24 hr tablet, Take 1 tablet (50 mg total) by mouth daily. For blood pressure control, Disp: 30 tablet, Rfl: 2   RIVAROXABAN (XARELTO) VTE STARTER PACK (15 & 20 MG), Take as directed on package: Start with one 15mg  tablet by mouth twice a day with food. On Day 22, switch to one 20mg  tablet once a day with food., Disp: 51 each, Rfl: 0   tamsulosin (FLOMAX) 0.4 MG CAPS capsule, Take 1 capsule (0.4 mg total) by mouth daily., Disp: 7 capsule, Rfl: 0  No Known Allergies  Objective:   BP 107/73   Pulse (!) 111   Temp (!) 95.5 F (35.3 C) (Temporal)   Ht 5\' 11"  (1.803 m)   Wt 188 lb 3.2 oz (85.4 kg)   SpO2 94%   BMI 26.25 kg/m    Physical Exam Vitals reviewed.  Constitutional:      General: He is not in acute distress.    Appearance: Normal appearance. He is not ill-appearing, toxic-appearing or diaphoretic.  HENT:  Head: Normocephalic and atraumatic.  Eyes:     General: Vision grossly intact. Gaze aligned appropriately. No scleral icterus.       Right eye: No discharge.        Left eye: Discharge (clear) present.No foreign body or hordeolum.     Extraocular Movements: Extraocular movements intact.     Conjunctiva/sclera: Conjunctivae normal.     Pupils: Pupils are equal, round, and reactive to light.  Cardiovascular:     Rate and Rhythm: Normal rate.  Pulmonary:     Effort: Pulmonary effort is normal. No respiratory distress.  Musculoskeletal:        General: Normal range of motion.     Cervical back: Normal range of motion.  Skin:    General: Skin is warm and dry.     Comments:  Swelling and erythema around left eye extending up forehead to midline and involving the upper eyelid.  Neurological:     Mental Status: He is alert and oriented to person, place, and time. Mental status is at baseline.  Psychiatric:        Mood and Affect: Mood normal.        Behavior: Behavior normal.        Thought Content: Thought content normal.        Judgment: Judgment normal.

## 2021-07-17 NOTE — Telephone Encounter (Signed)
Deniedon June 7 Your PA request has been denied.

## 2021-07-17 NOTE — Telephone Encounter (Signed)
It used to be cheap to pay cash for this. Check with pharmacy

## 2021-07-18 ENCOUNTER — Ambulatory Visit: Payer: 59 | Admitting: Family Medicine

## 2021-07-18 ENCOUNTER — Encounter: Payer: Self-pay | Admitting: Family Medicine

## 2021-07-18 VITALS — BP 107/70 | HR 83 | Temp 98.4°F | Ht 71.0 in | Wt 190.6 lb

## 2021-07-18 DIAGNOSIS — B0230 Zoster ocular disease, unspecified: Secondary | ICD-10-CM | POA: Diagnosis not present

## 2021-07-18 MED ORDER — VALACYCLOVIR HCL 1 G PO TABS: 1000.0000 mg | ORAL_TABLET | Freq: Three times a day (TID) | ORAL | 0 refills | Status: DC

## 2021-07-18 NOTE — Patient Instructions (Signed)
[facility] [address] [place], [place] [address]

## 2021-07-18 NOTE — Progress Notes (Signed)
Subjective:  Patient ID: [name], male    DOB: 05/07/1950  Age: 71 y.o. MRN: [id number]  CC: Follow-up and Atrial Fibrillation   HPI [name] presents for follow up of cellulitis. Now has erythema and vesicles on left scalp frontal to parietal area. Had some puffiness under the right eye. LEft eye swollen shut when he came back on 6/9. Some relief with Bactrim, but still having intractable pain. Now left eye vision has become blurred. Can only se images OS. Vision normal OD.   No chest pain. Hasn't started the xarelto. Worried about drug interactions. Couldn't get the Fioricet due to insurance not covering.      07/18/2021    8:51 AM 07/10/2021    9:51 AM 02/12/2021    4:10 PM  Depression screen PHQ 2/9  Decreased Interest 0 0 0  Down, Depressed, Hopeless 0 1 0  PHQ - 2 Score 0 1 0  Altered sleeping  1   Tired, decreased energy  1   Change in appetite  1   Feeling bad or failure about yourself   0   Trouble concentrating  0   Moving slowly or fidgety/restless  0   Suicidal thoughts  0   PHQ-9 Score  4   Difficult doing work/chores  Somewhat difficult     History [name] has a past medical history of Hypertension.   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   His family history is not on file.He reports that he has never smoked. He has never used smokeless tobacco. He reports that he does not drink alcohol and does not use drugs.    ROS Review of Systems  Constitutional:  Negative for fever.  Eyes:  Positive for pain, discharge, redness and visual disturbance.  Respiratory:  Negative for shortness of breath.   Cardiovascular:  Negative for chest pain.  Musculoskeletal:  Negative for arthralgias.  Skin:  Negative for rash.    Objective:  BP 107/70   Pulse 83   Temp 98.4 F (36.9 C)   Ht 5\' 11"  (1.803 m)   Wt 190 lb 9.6 oz (86.5 kg)   SpO2 97%   BMI 26.58 kg/m   BP Readings from Last 3  Encounters:  07/18/21 107/70  07/13/21 107/73  07/10/21 135/83    Wt Readings from Last 3 Encounters:  07/18/21 190 lb 9.6 oz (86.5 kg)  07/13/21 188 lb 3.2 oz (85.4 kg)  07/10/21 192 lb (87.1 kg)     Physical Exam Vitals reviewed.  Constitutional:      Appearance: He is well-developed.  HENT:     Head: Normocephalic and atraumatic.     Right Ear: External ear normal.     Left Ear: External ear normal.     Mouth/Throat:     Pharynx: No oropharyngeal exudate or posterior oropharyngeal erythema.  Eyes:     General: Gaze aligned appropriately.     Extraocular Movements: Extraocular movements intact.     Conjunctiva/sclera:     Left eye: Left conjunctiva is injected. Chemosis present. No exudate or hemorrhage.    Pupils: Pupils are equal, round, and reactive to light.     Comments: Left eye acuity: detects fingers only  Cardiovascular:     Rate and Rhythm: Normal rate and regular rhythm.     Heart sounds: No murmur heard. Pulmonary:     Effort: No respiratory distress.     Breath sounds: Normal  breath sounds.  Musculoskeletal:     Cervical back: Normal range of motion and neck supple.  Skin:    General: Skin is warm and dry.     Findings: Rash present. Rash is crusting (left fraontal to parietal scalp.) and vesicular.  Neurological:     Mental Status: He is alert and oriented to person, place, and time.       Assessment & Plan:   [name] was seen today for follow-up and atrial fibrillation.  Diagnoses and all orders for this visit:  Ocular herpes zoster -     Ambulatory referral to Ophthalmology  Other orders -     valACYclovir (VALTREX) 1000 MG tablet; Take 1 tablet (1,000 mg total) by mouth 3 (three) times daily.       I am having [name] start on valACYclovir. I am also having him maintain his acetaminophen, amLODipine, lisinopril, tamsulosin, metoprolol succinate, Butalbital-APAP-Caffeine, Rivaroxaban Stater Pack (15 mg and 20 mg),  sulfamethoxazole-trimethoprim, and ondansetron.  Allergies as of 07/18/2021   No Known Allergies      Medication List        Accurate as of July 18, 2021  9:30 AM. If you have any questions, ask your nurse or doctor.          acetaminophen 325 MG tablet Commonly known as: TYLENOL Take 1-2 tablets (325-650 mg total) by mouth every 6 (six) hours as needed for mild pain (pain score 1-3 or temp > 100.5).   amLODipine 10 MG tablet Commonly known as: NORVASC Take 1 tablet (10 mg total) by mouth daily.   Butalbital-APAP-Caffeine 50-300-40 MG Caps Take 1 tablet by mouth every 6 (six) hours as needed.   lisinopril 10 MG tablet Commonly known as: ZESTRIL Take 1 tablet (10 mg total) by mouth daily.   metoprolol succinate 50 MG 24 hr tablet Commonly known as: TOPROL-XL Take 1 tablet (50 mg total) by mouth daily. For blood pressure control   ondansetron 4 MG disintegrating tablet Commonly known as: ZOFRAN-ODT Take 1 tablet (4 mg total) by mouth every 8 (eight) hours as needed for nausea or vomiting.   Rivaroxaban Stater Pack (15 mg and 20 mg) Commonly known as: XARELTO STARTER PACK Take as directed on package: Start with one 15mg  tablet by mouth twice a day with food. On Day 22, switch to one 20mg  tablet once a day with food.   sulfamethoxazole-trimethoprim 800-160 MG tablet Commonly known as: Bactrim DS Take 1 tablet by mouth 2 (two) times daily for 10 days.   tamsulosin 0.4 MG Caps capsule Commonly known as: FLOMAX Take 1 capsule (0.4 mg total) by mouth daily.   valACYclovir 1000 MG tablet Commonly known as: VALTREX Take 1 tablet (1,000 mg total) by mouth 3 (three) times daily. Started by: Claretta Fraise, MD         Follow-up: No follow-ups on file.  Claretta Fraise, M.D.

## 2021-08-01 ENCOUNTER — Encounter: Payer: Self-pay | Admitting: Family Medicine

## 2021-08-01 ENCOUNTER — Ambulatory Visit: Payer: 59 | Admitting: Family Medicine

## 2021-08-01 VITALS — BP 93/56 | HR 84 | Temp 97.4°F | Ht 71.0 in | Wt 180.6 lb

## 2021-08-01 DIAGNOSIS — I480 Paroxysmal atrial fibrillation: Secondary | ICD-10-CM

## 2021-08-01 DIAGNOSIS — B0230 Zoster ocular disease, unspecified: Secondary | ICD-10-CM | POA: Diagnosis not present

## 2021-08-01 MED ORDER — PREDNISONE 10 MG PO TABS: ORAL_TABLET | ORAL | 0 refills | Status: DC

## 2021-08-01 MED ORDER — TRAMADOL HCL 50 MG PO TABS: 50.0000 mg | ORAL_TABLET | Freq: Four times a day (QID) | ORAL | 0 refills | Status: AC

## 2021-08-01 NOTE — Progress Notes (Signed)
Subjective:  Patient ID: [name], male    DOB: 08-05-1950  Age: 71 y.o. MRN: [id number]  CC: Follow-up (shingles)   HPI [name] presents for recheck of shingles.  There is still a lot of pain at the left forehead and temple area.  It is slightly less than it was before.  He has seen ophthalmology on 2 occasions.  So far his vision has not returned.  He can count fingers and seeing shapes.  The lesions have nearly cleared from the forehead.  He has no current systemic symptoms.  Atrial fibrillation follow up. Pt. is treated with rate control and anticoagulation. Pt.  denies palpitations, rapid rate, chest pain, dyspnea and edema. There has been no bleeding from nose or gums. Pt. has not noticed blood with urine or stool.  Although there is routine bruising easily, it is not excessive.      08/01/2021    7:55 AM 07/18/2021    8:51 AM 07/10/2021    9:51 AM  Depression screen PHQ 2/9  Decreased Interest 0 0 0  Down, Depressed, Hopeless 0 0 1  PHQ - 2 Score 0 0 1  Altered sleeping   1  Tired, decreased energy   1  Change in appetite   1  Feeling bad or failure about yourself    0  Trouble concentrating   0  Moving slowly or fidgety/restless   0  Suicidal thoughts   0  PHQ-9 Score   4  Difficult doing work/chores   Somewhat difficult    History [name] has a past medical history of Hypertension.   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   His family history is not on file.He reports that he has never smoked. He has never used smokeless tobacco. He reports that he does not drink alcohol and does not use drugs.    ROS Review of Systems  Constitutional:  Negative for fever.  Respiratory:  Negative for shortness of breath.   Cardiovascular:  Negative for chest pain.  Musculoskeletal:  Negative for arthralgias.  Skin:  Negative for rash.    Objective:  BP (!) 93/56   Pulse 84   Temp (!) 97.4 F (36.3  C)   Ht 5\' 11"  (1.803 m)   Wt 180 lb 9.6 oz (81.9 kg)   SpO2 97%   BMI 25.19 kg/m   BP Readings from Last 3 Encounters:  08/01/21 (!) 93/56  07/18/21 107/70  07/13/21 107/73    Wt Readings from Last 3 Encounters:  08/01/21 180 lb 9.6 oz (81.9 kg)  07/18/21 190 lb 9.6 oz (86.5 kg)  07/13/21 188 lb 3.2 oz (85.4 kg)     Physical Exam Vitals reviewed.  Constitutional:      Appearance: He is well-developed.  HENT:     Head: Normocephalic and atraumatic.     Right Ear: External ear normal.     Left Ear: External ear normal.     Mouth/Throat:     Pharynx: No oropharyngeal exudate or posterior oropharyngeal erythema.  Eyes:     Pupils: Pupils are equal, round, and reactive to light.  Cardiovascular:     Rate and Rhythm: Normal rate and regular rhythm.     Heart sounds: No murmur heard. Pulmonary:     Effort: No respiratory distress.     Breath sounds: Normal breath sounds.  Musculoskeletal:     Cervical back: Normal range of motion and  neck supple.  Skin:    Findings: Lesion (slight hypreemia where healing is taking place and new skin has formed at the left frontal scalp. No vesicles remain.) present.  Neurological:     Mental Status: He is alert and oriented to person, place, and time.       Assessment & Plan:   [name] was seen today for follow-up.  Diagnoses and all orders for this visit:  Ocular herpes zoster  Paroxysmal atrial fibrillation (HCC) -     AMB Referral to [facility]  Other orders -     traMADol (ULTRAM) 50 MG tablet; Take 1 tablet (50 mg total) by mouth 4 (four) times daily for 5 days. 1-2 tablets up to 4 times a day as needed for pain -     predniSONE (DELTASONE) 10 MG tablet; Take 5 daily for 2 days followed by 4,3,2 and 1 for 2 days each.       I am having [name] start on traMADol and predniSONE. I am also having him maintain his acetaminophen, tamsulosin, metoprolol succinate, Butalbital-APAP-Caffeine, Rivaroxaban  Stater Pack (15 mg and 20 mg), ondansetron, and valACYclovir.  Allergies as of 08/01/2021   No Known Allergies      Medication List        Accurate as of August 01, 2021 11:59 PM. If you have any questions, ask your nurse or doctor.          acetaminophen 325 MG tablet Commonly known as: TYLENOL Take 1-2 tablets (325-650 mg total) by mouth every 6 (six) hours as needed for mild pain (pain score 1-3 or temp > 100.5).   amLODipine 10 MG tablet Commonly known as: NORVASC Take 1 tablet (10 mg total) by mouth daily.   Butalbital-APAP-Caffeine 50-300-40 MG Caps Take 1 tablet by mouth every 6 (six) hours as needed.   lisinopril 10 MG tablet Commonly known as: ZESTRIL Take 1 tablet (10 mg total) by mouth daily.   metoprolol succinate 50 MG 24 hr tablet Commonly known as: TOPROL-XL Take 1 tablet (50 mg total) by mouth daily. For blood pressure control   ondansetron 4 MG disintegrating tablet Commonly known as: ZOFRAN-ODT Take 1 tablet (4 mg total) by mouth every 8 (eight) hours as needed for nausea or vomiting.   predniSONE 10 MG tablet Commonly known as: DELTASONE Take 5 daily for 2 days followed by 4,3,2 and 1 for 2 days each. Started by: Mechele Claude, MD   Rivaroxaban Stater Pack (15 mg and 20 mg) Commonly known as: XARELTO STARTER PACK Take as directed on package: Start with one 15mg  tablet by mouth twice a day with food. On Day 22, switch to one 20mg  tablet once a day with food.   tamsulosin 0.4 MG Caps capsule Commonly known as: FLOMAX Take 1 capsule (0.4 mg total) by mouth daily.   traMADol 50 MG tablet Commonly known as: ULTRAM Take 1 tablet (50 mg total) by mouth 4 (four) times daily for 5 days. 1-2 tablets up to 4 times a day as needed for pain Started by: , MD   valACYclovir 1000 MG tablet Commonly known as: VALTREX Take 1 tablet (1,000 mg total) by mouth 3 (three) times daily.         Follow-up: Return in about 3 months (around  11/01/2021), or if symptoms worsen or fail to improve. We discussed the possibility of PHN and possibly adding Lyrica to his pain regimen.  Mechele Claude, M.D.

## 2021-08-02 ENCOUNTER — Other Ambulatory Visit: Payer: Self-pay | Admitting: Family Medicine

## 2021-08-02 DIAGNOSIS — I10 Essential (primary) hypertension: Secondary | ICD-10-CM

## 2021-08-07 ENCOUNTER — Encounter: Payer: Self-pay | Admitting: Family Medicine

## 2021-08-08 ENCOUNTER — Other Ambulatory Visit: Payer: Self-pay | Admitting: Family Medicine

## 2021-08-14 ENCOUNTER — Other Ambulatory Visit: Payer: Self-pay | Admitting: Family Medicine

## 2021-08-14 ENCOUNTER — Telehealth: Payer: Self-pay | Admitting: Family Medicine

## 2021-08-14 ENCOUNTER — Ambulatory Visit: Payer: 59 | Admitting: Family Medicine

## 2021-08-14 MED ORDER — PREGABALIN 50 MG PO CAPS: ORAL_CAPSULE | ORAL | 0 refills | Status: DC

## 2021-08-14 NOTE — Telephone Encounter (Signed)
Gabapentin are similar medications for the pain [name] is experiencing. Lyrica is the better of the two. I will send in a prescription of that for him to try. I will need to see [name] in a month.

## 2021-08-14 NOTE — Telephone Encounter (Signed)
Last seen [facility] on 6/28. Considered Lyrica at that time. Will get advise from Dr. Darlyn Read and follow up with pt.

## 2021-08-14 NOTE — Telephone Encounter (Signed)
Daughter has been informed. Appt scheduled for 8/9 at 10:55am.

## 2021-08-14 NOTE — Telephone Encounter (Signed)
Pts daughter called stating that pt has been seen here recently a few times for shingles in his eyes. Was advised to see the eye doctor. Says pt saw the eye doctor and was recommended to reach out to his PCP to get Dr Darlyn Read to prescribe him Gabapentin for the shingles.  Please advise and call daughter.

## 2021-08-16 ENCOUNTER — Other Ambulatory Visit: Payer: Self-pay | Admitting: Family Medicine

## 2021-08-16 ENCOUNTER — Telehealth: Payer: Self-pay | Admitting: Family Medicine

## 2021-08-16 MED ORDER — RIVAROXABAN 20 MG PO TABS: 20.0000 mg | ORAL_TABLET | Freq: Every day | ORAL | 2 refills | Status: DC

## 2021-08-16 NOTE — Telephone Encounter (Signed)
Continue with one a day. I sent in refills

## 2021-08-16 NOTE — Telephone Encounter (Signed)
Pt has been notified.

## 2021-09-12 ENCOUNTER — Ambulatory Visit: Payer: 59 | Admitting: Family Medicine

## 2021-09-12 ENCOUNTER — Encounter: Payer: Self-pay | Admitting: Family Medicine

## 2021-09-12 VITALS — BP 114/79 | HR 78 | Temp 99.3°F | Ht 71.0 in | Wt 187.0 lb

## 2021-09-12 DIAGNOSIS — Z23 Encounter for immunization: Secondary | ICD-10-CM | POA: Diagnosis not present

## 2021-09-12 DIAGNOSIS — B0229 Other postherpetic nervous system involvement: Secondary | ICD-10-CM | POA: Diagnosis not present

## 2021-09-12 MED ORDER — PREGABALIN 300 MG PO CAPS: 300.0000 mg | ORAL_CAPSULE | Freq: Every day | ORAL | 1 refills | Status: DC

## 2021-09-12 MED ORDER — RIVAROXABAN 20 MG PO TABS
20.0000 mg | ORAL_TABLET | Freq: Every day | ORAL | 3 refills | Status: DC
Start: 2021-09-12 — End: 2022-07-31

## 2021-09-12 NOTE — Progress Notes (Signed)
Subjective:  Patient ID: [name], male    DOB: 06/27/1950  Age: 71 y.o. MRN: [id number]  CC: Herpes Zoster and Follow-up   HPI [name] presents for recheck of shinngles. Left scalp still tender.  Vision clear now. A little blurry but able to read.  Needs 90 day supply of lyrica.   Atrial fibrillation follow up. Pt. is treated with rate control and anticoagulation. Pt.  denies palpitations, rapid rate, chest pain, dyspnea and edema. There has been no bleeding from nose or gums. Pt. has not noticed blood with urine or stool.  Although there is routine bruising easily, it is not excessive.      09/12/2021   10:58 AM 08/01/2021    7:55 AM 07/18/2021    8:51 AM  Depression screen PHQ 2/9  Decreased Interest 0 0 0  Down, Depressed, Hopeless 0 0 0  PHQ - 2 Score 0 0 0    History [name] has a past medical history of Hypertension.   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   His family history is not on file.He reports that he has never smoked. He has never used smokeless tobacco. He reports that he does not drink alcohol and does not use drugs.    ROS Review of Systems  Constitutional:  Negative for fever.  Respiratory:  Negative for shortness of breath.   Cardiovascular:  Negative for chest pain.  Musculoskeletal:  Negative for arthralgias.  Skin:  Negative for rash.    Objective:  BP 114/79   Pulse 78   Temp 99.3 F (37.4 C)   Ht 5\' 11"  (1.803 m)   Wt 187 lb (84.8 kg)   SpO2 97%   BMI 26.08 kg/m   BP Readings from Last 3 Encounters:  09/12/21 114/79  08/01/21 (!) 93/56  07/18/21 107/70    Wt Readings from Last 3 Encounters:  09/12/21 187 lb (84.8 kg)  08/01/21 180 lb 9.6 oz (81.9 kg)  07/18/21 190 lb 9.6 oz (86.5 kg)     Physical Exam Vitals reviewed.  Constitutional:      Appearance: He is well-developed.  HENT:     Head: Normocephalic and atraumatic.     Right Ear:  External ear normal.     Left Ear: External ear normal.     Mouth/Throat:     Pharynx: No oropharyngeal exudate or posterior oropharyngeal erythema.  Eyes:     Pupils: Pupils are equal, round, and reactive to light.  Cardiovascular:     Rate and Rhythm: Normal rate and regular rhythm.     Heart sounds: No murmur heard. Pulmonary:     Effort: No respiratory distress.     Breath sounds: Normal breath sounds.  Musculoskeletal:     Cervical back: Normal range of motion and neck supple.  Skin:    Comments: Tender at left frontal scalp. No lesions  Neurological:     Mental Status: He is alert and oriented to person, place, and time.       Assessment & Plan:   [name] was seen today for herpes zoster and follow-up.  Diagnoses and all orders for this visit:  Need for Tdap vaccination -     Tdap vaccine greater than or equal to 7yo IM  PHN (postherpetic neuralgia)  Other orders -     pregabalin (LYRICA) 300 MG capsule; Take 1 capsule (300 mg total) by mouth at bedtime. -  rivaroxaban (XARELTO) 20 MG TABS tablet; Take 1 tablet (20 mg total) by mouth daily with supper.       I have changed [name]'s pregabalin. I am also having him maintain his acetaminophen, tamsulosin, metoprolol succinate, Butalbital-APAP-Caffeine, ondansetron, valACYclovir, predniSONE, amLODipine, lisinopril, and rivaroxaban.  Allergies as of 09/12/2021   No Known Allergies      Medication List        Accurate as of September 12, 2021  6:16 PM. If you have any questions, ask your nurse or doctor.          acetaminophen 325 MG tablet Commonly known as: TYLENOL Take 1-2 tablets (325-650 mg total) by mouth every 6 (six) hours as needed for mild pain (pain score 1-3 or temp > 100.5).   amLODipine 10 MG tablet Commonly known as: NORVASC TAKE 1 TABLET BY MOUTH EVERY DAY   Butalbital-APAP-Caffeine 50-300-40 MG Caps Take 1 tablet by mouth every 6 (six) hours as needed.   lisinopril 10 MG  tablet Commonly known as: ZESTRIL TAKE 1 TABLET BY MOUTH EVERY DAY   metoprolol succinate 50 MG 24 hr tablet Commonly known as: TOPROL-XL Take 1 tablet (50 mg total) by mouth daily. For blood pressure control   ondansetron 4 MG disintegrating tablet Commonly known as: ZOFRAN-ODT Take 1 tablet (4 mg total) by mouth every 8 (eight) hours as needed for nausea or vomiting.   predniSONE 10 MG tablet Commonly known as: DELTASONE Take 5 daily for 2 days followed by 4,3,2 and 1 for 2 days each.   pregabalin 300 MG capsule Commonly known as: Lyrica Take 1 capsule (300 mg total) by mouth at bedtime. What changed:  medication strength how much to take how to take this when to take this additional instructions Changed by: Mechele Claude, MD   rivaroxaban 20 MG Tabs tablet Commonly known as: Xarelto Take 1 tablet (20 mg total) by mouth daily with supper.   tamsulosin 0.4 MG Caps capsule Commonly known as: FLOMAX Take 1 capsule (0.4 mg total) by mouth daily.   valACYclovir 1000 MG tablet Commonly known as: VALTREX Take 1 tablet (1,000 mg total) by mouth 3 (three) times daily.         Follow-up: Return in about 6 weeks (around 10/24/2021).  Mechele Claude, M.D.

## 2021-10-01 ENCOUNTER — Other Ambulatory Visit: Payer: Self-pay | Admitting: Family Medicine

## 2021-10-24 ENCOUNTER — Ambulatory Visit: Payer: Medicare Other | Admitting: Family Medicine

## 2021-11-06 ENCOUNTER — Encounter: Payer: Self-pay | Admitting: Family Medicine

## 2021-11-06 ENCOUNTER — Ambulatory Visit: Payer: 59 | Admitting: Family Medicine

## 2021-11-06 VITALS — BP 138/90 | HR 75 | Temp 97.8°F | Ht 71.0 in | Wt 192.4 lb

## 2021-11-06 DIAGNOSIS — B0229 Other postherpetic nervous system involvement: Secondary | ICD-10-CM

## 2021-11-06 DIAGNOSIS — R7303 Prediabetes: Secondary | ICD-10-CM | POA: Diagnosis not present

## 2021-11-06 DIAGNOSIS — I10 Essential (primary) hypertension: Secondary | ICD-10-CM | POA: Diagnosis not present

## 2021-11-06 DIAGNOSIS — E785 Hyperlipidemia, unspecified: Secondary | ICD-10-CM | POA: Diagnosis not present

## 2021-11-06 DIAGNOSIS — Z79899 Other long term (current) drug therapy: Secondary | ICD-10-CM | POA: Diagnosis not present

## 2021-11-06 LAB — BAYER DCA HB A1C WAIVED: HB A1C (BAYER DCA - WAIVED): 6 % — ABNORMAL HIGH (ref 4.8–5.6)

## 2021-11-06 MED ORDER — LISINOPRIL 10 MG PO TABS: 10.0000 mg | ORAL_TABLET | Freq: Every day | ORAL | 2 refills | Status: DC

## 2021-11-06 MED ORDER — PREGABALIN 150 MG PO CAPS: 300.0000 mg | ORAL_CAPSULE | Freq: Every day | ORAL | 1 refills | Status: DC

## 2021-11-06 MED ORDER — AMLODIPINE BESYLATE 10 MG PO TABS: 10.0000 mg | ORAL_TABLET | Freq: Every day | ORAL | 2 refills | Status: DC

## 2021-11-06 MED ORDER — METOPROLOL SUCCINATE ER 50 MG PO TB24: 50.0000 mg | ORAL_TABLET | Freq: Every day | ORAL | 2 refills | Status: DC

## 2021-11-06 NOTE — Progress Notes (Signed)
Subjective:  Patient ID: [name], male    DOB: 1950/04/13  Age: 71 y.o. MRN: [id number]  CC: Medical Management of Chronic Issues   HPI [name] presents for  follow-up of hypertension. Pt. Feeling keyed up today. BP higher than what he usually gets at home. Last was 117/78. Patient has no history of headache chest pain or shortness of breath or recent cough. Patient also denies symptoms of TIA such as focal numbness or weakness. Patient denies side effects from medication. States taking it regularly.  Post herpetic neuralgia under good control with Lyrica. Affects left frontotemporal area, numb with itching. Eye waters. Pain on left side of his head. Can't wear a cap. Wind on his head is painful.  Lyrica 300 mg too large to swallow.   Trying to keep A1c down   History [name] has a past medical history of Hypertension.   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   His family history is not on file.He reports that he has never smoked. He has never used smokeless tobacco. He reports that he does not drink alcohol and does not use drugs.  Current Outpatient Medications on File Prior to Visit  Medication Sig Dispense Refill   acetaminophen (TYLENOL) 325 MG tablet Take 1-2 tablets (325-650 mg total) by mouth every 6 (six) hours as needed for mild pain (pain score 1-3 or temp > 100.5).     Butalbital-APAP-Caffeine 50-300-40 MG CAPS Take 1 tablet by mouth every 6 (six) hours as needed. 30 capsule 2   ondansetron (ZOFRAN-ODT) 4 MG disintegrating tablet Take 1 tablet (4 mg total) by mouth every 8 (eight) hours as needed for nausea or vomiting. 20 tablet 0   rivaroxaban (XARELTO) 20 MG TABS tablet Take 1 tablet (20 mg total) by mouth daily with supper. 90 tablet 3   tamsulosin (FLOMAX) 0.4 MG CAPS capsule Take 1 capsule (0.4 mg total) by mouth daily. 7 capsule 0   valACYclovir (VALTREX) 1000 MG tablet Take 1 tablet (1,000  mg total) by mouth 3 (three) times daily. 21 tablet 0   No current facility-administered medications on file prior to visit.    ROS Review of Systems  Constitutional:  Negative for fever.  Respiratory:  Negative for shortness of breath.   Cardiovascular:  Negative for chest pain.  Musculoskeletal:  Negative for arthralgias.  Skin:  Negative for rash.    Objective:  BP (!) 138/90   Pulse 75   Temp 97.8 F (36.6 C)   Ht _0  (1.803 m)   Wt 192 lb 6.4 oz (87.3 kg)   SpO2 98%   BMI 26.83 kg/m   BP Readings from Last 3 Encounters:  11/06/21 (!) 138/90  09/12/21 114/79  08/01/21 (!) 93/56    Wt Readings from Last 3 Encounters:  11/06/21 192 lb 6.4 oz (87.3 kg)  09/12/21 187 lb (84.8 kg)  08/01/21 180 lb 9.6 oz (81.9 kg)     Physical Exam Vitals reviewed.  Constitutional:      Appearance: He is well-developed.  HENT:     Head: Normocephalic and atraumatic.     Right Ear: External ear normal.     Left Ear: External ear normal.     Mouth/Throat:     Pharynx: No oropharyngeal exudate or posterior oropharyngeal erythema.  Eyes:     Pupils: Pupils are equal, round, and reactive to light.  Cardiovascular:     Rate and Rhythm:  Normal rate and regular rhythm.     Heart sounds: No murmur heard. Pulmonary:     Effort: No respiratory distress.     Breath sounds: Normal breath sounds.  Musculoskeletal:     Cervical back: Normal range of motion and neck supple.  Neurological:     Mental Status: He is alert and oriented to person, place, and time.       Assessment & Plan:   [name] was seen today for medical management of chronic issues.  Diagnoses and all orders for this visit:  Prediabetes -     Bayer DCA Hb A1c Waived  Essential hypertension -     CBC with Differential/Platelet -     CMP14+EGFR -     amLODipine (NORVASC) 10 MG tablet; Take 1 tablet (10 mg total) by mouth daily. -     lisinopril (ZESTRIL) 10 MG tablet; Take 1 tablet (10 mg total) by mouth  daily.  Hyperlipidemia, unspecified hyperlipidemia type -     Lipid panel  Controlled substance agreement signed -     ToxASSURE Select 13 (MW), Urine  PHN (postherpetic neuralgia)  Other orders -     metoprolol succinate (TOPROL-XL) 50 MG 24 hr tablet; Take 1 tablet (50 mg total) by mouth daily. For blood pressure control -     pregabalin (LYRICA) 150 MG capsule; Take 2 capsules (300 mg total) by mouth at bedtime.   Allergies as of 11/06/2021   No Known Allergies      Medication List        Accurate as of November 06, 2021  8:16 PM. If you have any questions, ask your nurse or doctor.          STOP taking these medications    predniSONE 10 MG tablet Commonly known as: DELTASONE Stopped by: Claretta Fraise, MD       TAKE these medications    acetaminophen 325 MG tablet Commonly known as: TYLENOL Take 1-2 tablets (325-650 mg total) by mouth every 6 (six) hours as needed for mild pain (pain score 1-3 or temp > 100.5).   amLODipine 10 MG tablet Commonly known as: NORVASC Take 1 tablet (10 mg total) by mouth daily.   Butalbital-APAP-Caffeine 50-300-40 MG Caps Take 1 tablet by mouth every 6 (six) hours as needed.   lisinopril 10 MG tablet Commonly known as: ZESTRIL Take 1 tablet (10 mg total) by mouth daily.   metoprolol succinate 50 MG 24 hr tablet Commonly known as: TOPROL-XL Take 1 tablet (50 mg total) by mouth daily. For blood pressure control   ondansetron 4 MG disintegrating tablet Commonly known as: ZOFRAN-ODT Take 1 tablet (4 mg total) by mouth every 8 (eight) hours as needed for nausea or vomiting.   pregabalin 150 MG capsule Commonly known as: Lyrica Take 2 capsules (300 mg total) by mouth at bedtime. What changed: medication strength Changed by: Claretta Fraise, MD   rivaroxaban 20 MG Tabs tablet Commonly known as: Xarelto Take 1 tablet (20 mg total) by mouth daily with supper.   tamsulosin 0.4 MG Caps capsule Commonly known as: FLOMAX Take 1  capsule (0.4 mg total) by mouth daily.   valACYclovir 1000 MG tablet Commonly known as: VALTREX Take 1 tablet (1,000 mg total) by mouth 3 (three) times daily.        Meds ordered this encounter  Medications   amLODipine (NORVASC) 10 MG tablet    Sig: Take 1 tablet (10 mg total) by mouth daily.    Dispense:  90 tablet    Refill:  2   lisinopril (ZESTRIL) 10 MG tablet    Sig: Take 1 tablet (10 mg total) by mouth daily.    Dispense:  90 tablet    Refill:  2   metoprolol succinate (TOPROL-XL) 50 MG 24 hr tablet    Sig: Take 1 tablet (50 mg total) by mouth daily. For blood pressure control    Dispense:  90 tablet    Refill:  2   pregabalin (LYRICA) 150 MG capsule    Sig: Take 2 capsules (300 mg total) by mouth at bedtime.    Dispense:  180 capsule    Refill:  1    Pt. To check BP at home daily. Notify me if trend is over 130/80 at rest.  Follow-up: Return in about 6 months (around 05/08/2022).  Claretta Fraise, M.D.

## 2021-11-07 LAB — CMP14+EGFR
ALT: 21 IU/L (ref 0–44)
AST: 26 IU/L (ref 0–40)
Albumin/Globulin Ratio: 2 (ref 1.2–2.2)
Albumin: 4.7 g/dL (ref 3.9–4.9)
Alkaline Phosphatase: 100 IU/L (ref 44–121)
BUN/Creatinine Ratio: 14 (ref 10–24)
BUN: 17 mg/dL (ref 8–27)
Bilirubin Total: 0.9 mg/dL (ref 0.0–1.2)
CO2: 20 mmol/L (ref 20–29)
Calcium: 9.8 mg/dL (ref 8.6–10.2)
Chloride: 107 mmol/L — ABNORMAL HIGH (ref 96–106)
Creatinine, Ser: 1.23 mg/dL (ref 0.76–1.27)
Globulin, Total: 2.4 g/dL (ref 1.5–4.5)
Glucose: 124 mg/dL — ABNORMAL HIGH (ref 70–99)
Potassium: 4.4 mmol/L (ref 3.5–5.2)
Sodium: 146 mmol/L — ABNORMAL HIGH (ref 134–144)
Total Protein: 7.1 g/dL (ref 6.0–8.5)
eGFR: 64 mL/min/{1.73_m2} (ref >59)

## 2021-11-07 LAB — CBC WITH DIFFERENTIAL/PLATELET
Basophils Absolute: 0.1 10*3/uL (ref 0.0–0.2)
Basos: 1 %
EOS (ABSOLUTE): 0.2 10*3/uL (ref 0.0–0.4)
Eos: 4 %
Hematocrit: 45.9 % (ref 37.5–51.0)
Hemoglobin: 15.6 g/dL (ref 13.0–17.7)
Immature Grans (Abs): 0 10*3/uL (ref 0.0–0.1)
Immature Granulocytes: 0 %
Lymphocytes Absolute: 2 10*3/uL (ref 0.7–3.1)
Lymphs: 32 %
MCH: 31 pg (ref 26.6–33.0)
MCHC: 34 g/dL (ref 31.5–35.7)
MCV: 91 fL (ref 79–97)
Monocytes Absolute: 0.6 10*3/uL (ref 0.1–0.9)
Monocytes: 10 %
Neutrophils Absolute: 3.4 10*3/uL (ref 1.4–7.0)
Neutrophils: 53 %
Platelets: 207 10*3/uL (ref 150–450)
RBC: 5.04 x10E6/uL (ref 4.14–5.80)
RDW: 12 % (ref 11.6–15.4)
WBC: 6.4 10*3/uL (ref 3.4–10.8)

## 2021-11-07 LAB — LIPID PANEL
Chol/HDL Ratio: 3.7 ratio (ref 0.0–5.0)
Cholesterol, Total: 128 mg/dL (ref 100–199)
HDL: 35 mg/dL — ABNORMAL LOW (ref >39)
LDL Chol Calc (NIH): 74 mg/dL (ref 0–99)
Triglycerides: 100 mg/dL (ref 0–149)
VLDL Cholesterol Cal: 19 mg/dL (ref 5–40)

## 2021-11-07 NOTE — Progress Notes (Signed)
Hello [name],  Your lab result is normal and/or stable.Some minor variations that are not significant are commonly marked abnormal, but do not represent any medical problem for you.  Best regards, Onyx Schirmer, M.D.

## 2021-11-08 LAB — TOXASSURE SELECT 13 (MW), URINE

## 2022-01-15 ENCOUNTER — Encounter: Payer: Self-pay | Admitting: Cardiology

## 2022-01-15 ENCOUNTER — Ambulatory Visit: Payer: 59 | Attending: Cardiology | Admitting: Cardiology

## 2022-01-15 VITALS — BP 150/90 | HR 89 | Ht 71.0 in | Wt 195.8 lb

## 2022-01-15 DIAGNOSIS — I4891 Unspecified atrial fibrillation: Secondary | ICD-10-CM | POA: Diagnosis not present

## 2022-01-15 DIAGNOSIS — I10 Essential (primary) hypertension: Secondary | ICD-10-CM | POA: Diagnosis not present

## 2022-01-15 DIAGNOSIS — D6869 Other thrombophilia: Secondary | ICD-10-CM

## 2022-01-15 NOTE — Progress Notes (Signed)
Clinical Summary [name] is a 71 y.o.male seen today for follow up of the following medical problem.s        1.Afib - diagnosed during 07/2021 ER visit with headache, was started anticoag - our first visit since his diagnosis   - no palpitaitons, he is on toprol 50mg  daily - no significant bleeding on xarelto - diagnosed with shingles at the time, had N/V significant headache.    2. 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     amLODipine (NORVASC) 10 MG tablet Take 1 tablet (10 mg total) by mouth daily. 90 tablet 2   Butalbital-APAP-Caffeine 50-300-40 MG CAPS Take 1 tablet by mouth every 6 (six) hours as needed. 30 capsule 2   lisinopril (ZESTRIL) 10 MG tablet Take 1 tablet (10 mg total) by mouth daily. 90 tablet 2   metoprolol succinate (TOPROL-XL) 50 MG 24 hr tablet Take 1 tablet (50 mg total) by mouth daily. For blood pressure control 90 tablet 2   ondansetron (ZOFRAN-ODT) 4 MG disintegrating tablet Take 1 tablet (4 mg total) by mouth every 8 (eight) hours as needed for nausea or vomiting. 20 tablet 0   prednisoLONE acetate (PRED FORTE) 1 % ophthalmic suspension Place into the left eye.     pregabalin (LYRICA) 150 MG capsule Take 2 capsules (300 mg total) by mouth at bedtime. 180 capsule 1   rivaroxaban (XARELTO) 20 MG TABS tablet Take 1 tablet (20 mg total) by mouth daily with supper. 90 tablet 3   valACYclovir (VALTREX) 1000 MG tablet Take 1 tablet (1,000 mg total) by mouth 3 (three)  times daily. 21 tablet 0   tamsulosin (FLOMAX) 0.4 MG CAPS capsule Take 1 capsule (0.4 mg total) by mouth daily. (Patient not taking: Reported on 01/15/2022) 7 capsule 0   No current facility-administered medications for this visit.     Past Surgical History:  Procedure Laterality Date   NO PAST SURGERIES     TOTAL HIP ARTHROPLASTY Right 06/29/2019   Procedure: RIGHT TOTAL HIP ARTHROPLASTY;  Surgeon: 07/01/2019, MD;  Location: [facility];  Service: Orthopedics;  Laterality: Right;     No Known Allergies    History reviewed. No pertinent family history.   Social History [name] reports that he has never smoked. He has never used smokeless tobacco. [name] reports no history of alcohol use.   Review of Systems CONSTITUTIONAL: No weight loss, fever, chills, weakness or fatigue.  HEENT: Eyes: No visual loss, blurred vision, double vision or yellow sclerae.No hearing loss, sneezing, congestion, runny nose or sore throat.  SKIN: No rash or itching.  CARDIOVASCULAR: per hpi RESPIRATORY: No shortness of breath, cough or sputum.  GASTROINTESTINAL: No anorexia, nausea, vomiting or diarrhea. No abdominal pain or blood.  GENITOURINARY: No burning on urination, no polyuria NEUROLOGICAL: No headache, dizziness, syncope, paralysis, ataxia, numbness or tingling in the extremities. No change  in bowel or bladder control.  MUSCULOSKELETAL: No muscle, back pain, joint pain or stiffness.  LYMPHATICS: No enlarged nodes. No history of splenectomy.  PSYCHIATRIC: No history of depression or anxiety.  ENDOCRINOLOGIC: No reports of sweating, cold or heat intolerance. No polyuria or polydipsia.  Marland Kitchen   Physical Examination Today's Vitals   01/15/22 0846 01/15/22 0917  BP: (!) 152/96 (!) 150/90  Pulse: 89   SpO2: 98%   Weight: 195 lb 12.8 oz (88.8 kg)   Height: 5\' 11"  (1.803 m)    Body mass index is 27.31 kg/m.  Gen: resting comfortably, no acute distress HEENT: no scleral icterus,  pupils equal round and reactive, no palptable cervical adenopathy,  CV: irreg, no m/r/g no jvd Resp: Clear to auscultation bilaterally GI: abdomen is soft, non-tender, non-distended, normal bowel sounds, no hepatosplenomegaly MSK: extremities are warm, no edema.  Skin: warm, no rash Neuro:  no focal deficits Psych: appropriate affect   Diagnostic Studies     Assessment and Plan   1. Persistent Afib - new diagnosis during 07/2021 ER visit with headache, incidental findings of afib on EKG - rate controlled on toprol, he has never been symptomatic - he is on xarelto for stroke prevention, CHADS2Vasc score is 3 - EKG today shows rate controlled - he is asymptomatic and well rate controlled, I see no indication for attempt at DCCV - will obtain echo - conitnue current meds  2. HTN - elevated here, home numbers and numbers at pcp have been at goal - submit bp log 1 week     08/2021, M.D.

## 2022-01-15 NOTE — Patient Instructions (Signed)
Medication Instructions:  Your physician recommends that you continue on your current medications as directed. Please refer to the Current Medication list given to you today.   Labwork: None  Testing/Procedures: Your physician has requested that you have an echocardiogram. Echocardiography is a painless test that uses sound waves to create images of your heart. It provides your doctor with information about the size and shape of your heart and how well your heart's chambers and valves are working. This procedure takes approximately one hour. There are no restrictions for this procedure. Please do NOT wear cologne, perfume, aftershave, or lotions (deodorant is allowed). Please arrive 15 minutes prior to your appointment time.   Follow-Up: Follow up with Dr. Wyline Mood in 6 months.   Any Other Special Instructions Will Be Listed Below (If Applicable).  Please track BP for 1 week and call us with readings.    If you need a refill on your cardiac medications before your next appointment, please call your pharmacy.

## 2022-01-21 ENCOUNTER — Other Ambulatory Visit (HOSPITAL_COMMUNITY): Payer: 59

## 2022-02-01 ENCOUNTER — Ambulatory Visit: Payer: 59 | Attending: Cardiology

## 2022-02-01 DIAGNOSIS — I4891 Unspecified atrial fibrillation: Secondary | ICD-10-CM

## 2022-02-01 MED ORDER — PERFLUTREN LIPID MICROSPHERE
1.0000 mL | INTRAVENOUS | Status: AC | PRN
  Administered 2022-02-01: 2 mL via INTRAVENOUS

## 2022-02-05 LAB — ECHOCARDIOGRAM COMPLETE
AR max vel: 3.25 cm2
AV Area VTI: 3.04 cm2
AV Area mean vel: 3.1 cm2
AV Mean grad: 4.7 mmHg
AV Peak grad: 7.9 mmHg
Ao pk vel: 1.4 m/s
Calc EF: 62.5 %
MV M vel: 4.55 m/s
MV Peak grad: 82.8 mmHg
S' Lateral: 2.5 cm
Single Plane A2C EF: 62 %
Single Plane A4C EF: 64.7 %

## 2022-05-08 ENCOUNTER — Ambulatory Visit: Payer: 59 | Admitting: Family Medicine

## 2022-05-08 ENCOUNTER — Encounter: Payer: Self-pay | Admitting: Family Medicine

## 2022-05-08 VITALS — BP 116/70 | HR 67 | Temp 98.0°F | Ht 71.0 in | Wt 200.0 lb

## 2022-05-08 DIAGNOSIS — I10 Essential (primary) hypertension: Secondary | ICD-10-CM | POA: Diagnosis not present

## 2022-05-08 DIAGNOSIS — E785 Hyperlipidemia, unspecified: Secondary | ICD-10-CM

## 2022-05-08 DIAGNOSIS — Z125 Encounter for screening for malignant neoplasm of prostate: Secondary | ICD-10-CM

## 2022-05-08 DIAGNOSIS — E119 Type 2 diabetes mellitus without complications: Secondary | ICD-10-CM

## 2022-05-08 DIAGNOSIS — B0229 Other postherpetic nervous system involvement: Secondary | ICD-10-CM

## 2022-05-08 LAB — BAYER DCA HB A1C WAIVED: HB A1C (BAYER DCA - WAIVED): 6.5 % — ABNORMAL HIGH (ref 4.8–5.6)

## 2022-05-08 MED ORDER — METFORMIN HCL ER 500 MG PO TB24: 500.0000 mg | ORAL_TABLET | Freq: Every day | ORAL | 2 refills | Status: DC

## 2022-05-08 MED ORDER — PREGABALIN 150 MG PO CAPS: 300.0000 mg | ORAL_CAPSULE | Freq: Every day | ORAL | 1 refills | Status: DC

## 2022-05-08 NOTE — Progress Notes (Signed)
Subjective:  Patient ID: [name],  male    DOB: 15-Apr-1950  Age: 72 y.o.    CC: Medical Management of Chronic Issues   HPI [name] presents for  follow-up of hypertension. Patient has no history of headache chest pain or shortness of breath or recent cough. Patient also denies symptoms of TIA such as numbness weakness lateralizing. Patient denies side effects from medication. States taking it regularly.  Patient also  in for follow-up of elevated cholesterol. Doing well without complaints on current medication. Denies side effects  including myalgia and arthralgia and nausea. Also in today for liver function testing. Currently no chest pain, shortness of breath or other cardiovascular related symptoms noted.  Follow-up of prediabetes. Patient denies symptoms such as excessive hunger or urinary frequency, excessive hunger, nausea No significant hypoglycemic spells noted. Medications reviewed. Pt reports taking them regularly. Pt. denies complication/adverse reaction today.   Taking lyrica daily for relief of postherpetic neuralgia. Left forehead still tingling. Numb. Left eye waters randomly. Ophthalmology having him take valtrex 1 gm daily for 1 year. IT is better, but not completely healed.       05/08/2022    8:05 AM 05/08/2022    7:50 AM 11/06/2021    7:58 AM 09/12/2021   10:58 AM 08/01/2021    7:55 AM  Depression screen PHQ 2/9  Decreased Interest 0 0 0 0 0  Down, Depressed, Hopeless 0 0 0 0 0  PHQ - 2 Score 0 0 0 0 0       Sister in law passed recently from stroke brought on by pancreatic cancer. Family found it was from the treatment. Pt.s daughter is having to see a psychiatrist. Pt. States it happened so fast - 6 weeks from diagnosis   History [name] has a past medical history of Hypertension and Shingles.   [name] has a past surgical history that includes No past surgeries and Total hip arthroplasty (Right, 06/29/2019).   His family history is not on file.[name]  reports that [name] has never smoked. [name] has never used smokeless tobacco. [name] reports that [name] does not drink alcohol and does not use drugs.  Current Outpatient Medications on File Prior to Visit  Medication Sig Dispense Refill   amLODipine (NORVASC) 10 MG tablet Take 1 tablet (10 mg total) by mouth daily. 90 tablet 2   Butalbital-APAP-Caffeine 50-300-40 MG CAPS Take 1 tablet by mouth every 6 (six) hours as needed. 30 capsule 2   lisinopril (ZESTRIL) 10 MG tablet Take 1 tablet (10 mg total) by mouth daily. 90 tablet 2   metoprolol succinate (TOPROL-XL) 50 MG 24 hr tablet Take 1 tablet (50 mg total) by mouth daily. For blood pressure control 90 tablet 2   prednisoLONE acetate (PRED FORTE) 1 % ophthalmic suspension Place 1 drop into the left eye every other day.     rivaroxaban (XARELTO) 20 MG TABS tablet Take 1 tablet (20 mg total) by mouth daily with supper. 90 tablet 3   valACYclovir (VALTREX) 1000 MG tablet Take 1,000 mg by mouth daily.     No current facility-administered medications on file prior to visit.    ROS Review of Systems  Constitutional:  Negative for fever.  Respiratory:  Negative for shortness of breath.   Cardiovascular:  Negative for chest pain.  Musculoskeletal:  Negative for arthralgias.  Skin:  Negative for rash.    Objective:  BP 116/70   Pulse 67   Temp 98 F (36.7 C)   Ht  5\' 11"  (1.803 m)   Wt 200 lb (90.7 kg)   SpO2 96%   BMI 27.89 kg/m   BP Readings from Last 3 Encounters:  05/08/22 116/70  01/15/22 (!) 150/90  11/06/21 (!) 138/90    Wt Readings from Last 3 Encounters:  05/08/22 200 lb (90.7 kg)  01/15/22 195 lb 12.8 oz (88.8 kg)  11/06/21 192 lb 6.4 oz (87.3 kg)     Physical Exam Vitals reviewed.  Constitutional:      Appearance: [name] is well-developed.  HENT:     Head: Normocephalic and atraumatic.     Right Ear: External ear normal.     Left Ear: External ear normal.     Mouth/Throat:     Pharynx: No oropharyngeal exudate or posterior  oropharyngeal erythema.  Eyes:     Pupils: Pupils are equal, round, and reactive to light.  Cardiovascular:     Rate and Rhythm: Normal rate and regular rhythm.     Heart sounds: No murmur heard. Pulmonary:     Effort: No respiratory distress.     Breath sounds: Normal breath sounds.  Musculoskeletal:     Cervical back: Normal range of motion and neck supple.  Neurological:     Mental Status: [name] is alert and oriented to person, place, and time.     Diabetic Foot Exam - Simple   No data filed     Lab Results  Component Value Date   HGBA1C 6.0 (H) 11/06/2021   HGBA1C 6.1 (H) 02/12/2021    Assessment & Plan:   [name] was seen today for medical management of chronic issues.  Diagnoses and all orders for this visit:  Essential hypertension -     CBC with Differential/Platelet -     CMP14+EGFR  New onset type 2 diabetes mellitus -     Bayer DCA Hb A1c Waived  Hyperlipidemia, unspecified hyperlipidemia type -     Lipid panel  Prostate cancer screening -     PSA, total and free  PHN (postherpetic neuralgia)  Other orders -     pregabalin (LYRICA) 150 MG capsule; Take 2 capsules (300 mg total) by mouth at bedtime. -     metFORMIN (GLUCOPHAGE-XR) 500 MG 24 hr tablet; Take 1 tablet (500 mg total) by mouth daily with breakfast.   I have discontinued [name]'s acetaminophen, tamsulosin, and ondansetron. I am also having him start on metFORMIN. Additionally, I am having him maintain his Butalbital-APAP-Caffeine, rivaroxaban, amLODipine, lisinopril, metoprolol succinate, pregabalin, prednisoLONE acetate, and valACYclovir.  Meds ordered this encounter  Medications   pregabalin (LYRICA) 150 MG capsule    Sig: Take 2 capsules (300 mg total) by mouth at bedtime.    Dispense:  180 capsule    Refill:  1   metFORMIN (GLUCOPHAGE-XR) 500 MG 24 hr tablet    Sig: Take 1 tablet (500 mg total) by mouth daily with breakfast.    Dispense:  30 tablet    Refill:  2      Follow-up: Return in about 3 months (around 08/07/2022).  Claretta Fraise, M.D.

## 2022-05-08 NOTE — Patient Instructions (Signed)

## 2022-05-09 LAB — CMP14+EGFR
ALT: 26 IU/L (ref 0–44)
AST: 41 IU/L — ABNORMAL HIGH (ref 0–40)
Albumin/Globulin Ratio: 1.5 (ref 1.2–2.2)
Albumin: 4 g/dL (ref 3.9–4.9)
Alkaline Phosphatase: 106 IU/L (ref 44–121)
BUN/Creatinine Ratio: 9 — ABNORMAL LOW (ref 10–24)
BUN: 10 mg/dL (ref 8–27)
Bilirubin Total: 0.9 mg/dL (ref 0.0–1.2)
CO2: 21 mmol/L (ref 20–29)
Calcium: 9.6 mg/dL (ref 8.6–10.2)
Chloride: 104 mmol/L (ref 96–106)
Creatinine, Ser: 1.15 mg/dL (ref 0.76–1.27)
Globulin, Total: 2.6 g/dL (ref 1.5–4.5)
Glucose: 193 mg/dL — ABNORMAL HIGH (ref 70–99)
Potassium: 3.8 mmol/L (ref 3.5–5.2)
Sodium: 142 mmol/L (ref 134–144)
Total Protein: 6.6 g/dL (ref 6.0–8.5)
eGFR: 69 mL/min/{1.73_m2} (ref >59)

## 2022-05-09 LAB — CBC WITH DIFFERENTIAL/PLATELET
Basophils Absolute: 0.1 10*3/uL (ref 0.0–0.2)
Basos: 1 %
EOS (ABSOLUTE): 0.2 10*3/uL (ref 0.0–0.4)
Eos: 2 %
Hematocrit: 49.4 % (ref 37.5–51.0)
Hemoglobin: 17 g/dL (ref 13.0–17.7)
Immature Grans (Abs): 0 10*3/uL (ref 0.0–0.1)
Immature Granulocytes: 0 %
Lymphocytes Absolute: 1.9 10*3/uL (ref 0.7–3.1)
Lymphs: 26 %
MCH: 31.1 pg (ref 26.6–33.0)
MCHC: 34.4 g/dL (ref 31.5–35.7)
MCV: 91 fL (ref 79–97)
Monocytes Absolute: 0.7 10*3/uL (ref 0.1–0.9)
Monocytes: 9 %
Neutrophils Absolute: 4.3 10*3/uL (ref 1.4–7.0)
Neutrophils: 62 %
Platelets: 207 10*3/uL (ref 150–450)
RBC: 5.46 x10E6/uL (ref 4.14–5.80)
RDW: 13.5 % (ref 11.6–15.4)
WBC: 7.1 10*3/uL (ref 3.4–10.8)

## 2022-05-09 LAB — LIPID PANEL
Chol/HDL Ratio: 4.1 ratio (ref 0.0–5.0)
Cholesterol, Total: 127 mg/dL (ref 100–199)
HDL: 31 mg/dL — ABNORMAL LOW (ref >39)
LDL Chol Calc (NIH): 69 mg/dL (ref 0–99)
Triglycerides: 154 mg/dL — ABNORMAL HIGH (ref 0–149)
VLDL Cholesterol Cal: 27 mg/dL (ref 5–40)

## 2022-05-09 LAB — PSA, TOTAL AND FREE
PSA, Free Pct: 24.5 %
PSA, Free: 0.27 ng/mL
Prostate Specific Ag, Serum: 1.1 ng/mL (ref 0.0–4.0)

## 2022-05-09 NOTE — Progress Notes (Signed)
Hello Gerron,  Your lab result is normal and/or stable.Some minor variations that are not significant are commonly marked abnormal, but do not represent any medical problem for you.  Best regards, Eugina Row, M.D.

## 2022-06-11 ENCOUNTER — Telehealth: Payer: Self-pay | Admitting: Family Medicine

## 2022-06-11 NOTE — Telephone Encounter (Signed)
Opened in error

## 2022-07-30 ENCOUNTER — Encounter: Payer: Self-pay | Admitting: Cardiology

## 2022-07-30 ENCOUNTER — Ambulatory Visit: Payer: 59 | Attending: Cardiology | Admitting: Cardiology

## 2022-07-30 VITALS — BP 128/78 | HR 78 | Ht 71.0 in | Wt 197.2 lb

## 2022-07-30 DIAGNOSIS — I10 Essential (primary) hypertension: Secondary | ICD-10-CM | POA: Diagnosis not present

## 2022-07-30 DIAGNOSIS — I4819 Other persistent atrial fibrillation: Secondary | ICD-10-CM

## 2022-07-30 DIAGNOSIS — D6869 Other thrombophilia: Secondary | ICD-10-CM | POA: Diagnosis not present

## 2022-07-30 DIAGNOSIS — I340 Nonrheumatic mitral (valve) insufficiency: Secondary | ICD-10-CM

## 2022-07-30 NOTE — Patient Instructions (Signed)

## 2022-07-30 NOTE — Progress Notes (Signed)
Clinical Summary Mr. Zielke is a 72 y.o.male seen today for follow up of the following medical problems.       1.Persistent Afib - diagnosed during 07/2021 ER visit - has never been symptomatic - Compliant with meds, no bleeding on xarelto.     2. HTN -compliant with meds     3. Mitral regurgitation - mild by recent echo  4. 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 90 tablet 2   Butalbital-APAP-Caffeine 50-300-40 MG CAPS Take 1 tablet by mouth every 6 (six) hours as needed. 30 capsule 2   lisinopril (ZESTRIL) 10 MG tablet Take 1 tablet (10 mg total) by mouth daily. 90 tablet 2   metFORMIN (GLUCOPHAGE-XR) 500 MG 24 hr tablet Take 1 tablet (500 mg total) by mouth daily with breakfast. 30 tablet 2   metoprolol succinate (TOPROL-XL) 50 MG 24 hr tablet Take 1 tablet (50 mg total) by mouth daily. For blood pressure control 90 tablet 2   prednisoLONE acetate (PRED FORTE) 1 % ophthalmic suspension Place 1 drop into the left eye every other day.     pregabalin (LYRICA) 150 MG capsule Take 2 capsules (300 mg total) by mouth at bedtime. 180 capsule 1   rivaroxaban (XARELTO) 20 MG TABS tablet Take 1 tablet (20 mg total) by mouth daily with supper. 90 tablet 3   valACYclovir (VALTREX) 1000 MG tablet Take 1,000 mg by mouth daily.     No current facility-administered medications for this visit.     Past Surgical History:  Procedure Laterality Date   NO PAST SURGERIES     TOTAL HIP ARTHROPLASTY Right 06/29/2019   Procedure: RIGHT TOTAL HIP ARTHROPLASTY;  Surgeon: Valeria Batman, MD;  Location: [facility];  Service: Orthopedics;  Laterality: Right;     No Known Allergies    No family  history on file.   Social History Mr. Kohan reports that he has never smoked. He has never used smokeless tobacco. Mr. Kossman reports no history of alcohol use.   Review of Systems CONSTITUTIONAL: No weight loss, fever, chills, weakness or fatigue.  HEENT: Eyes: No visual loss, blurred vision, double vision or yellow sclerae.No hearing loss, sneezing, congestion, runny nose or sore throat.  SKIN: No rash or itching.  CARDIOVASCULAR: per hpi RESPIRATORY: No shortness of breath, cough or sputum.  GASTROINTESTINAL: No anorexia, nausea, vomiting or diarrhea. No abdominal pain or blood.  GENITOURINARY: No burning on urination, no polyuria NEUROLOGICAL: No headache, dizziness, syncope, paralysis, ataxia, numbness or tingling in the extremities. No change in bowel or bladder control.  MUSCULOSKELETAL: No muscle, back pain, joint pain or stiffness.  LYMPHATICS: No enlarged nodes. No history of splenectomy.  PSYCHIATRIC: No history of depression or anxiety.  ENDOCRINOLOGIC: No reports of sweating, cold or heat intolerance. No polyuria or polydipsia.  Marland Kitchen   Physical Examination Today's Vitals   07/30/22 1412  BP: 128/78  Pulse: 78  SpO2: 97%  Weight: 197 lb 3.2 oz (89.4 kg)  Height: 5\' 11"  (1.803 m)   Body mass index is 27.5 kg/m.  Gen: resting comfortably, no acute distress HEENT:  no scleral icterus, pupils equal round and reactive, no palptable cervical adenopathy,  CV: irreg, no mrg, no jvd Resp: Clear to auscultation bilaterally GI: abdomen is soft, non-tender, non-distended, normal bowel sounds, no hepatosplenomegaly MSK: extremities are warm, no edema.  Skin: warm, no rash Neuro:  no focal deficits Psych: appropriate affect   Diagnostic Studies 01/2022 echo:  1. Left ventricular ejection fraction, by estimation, is 60 to 65%. The  left ventricle has normal function. The left ventricle has no regional  wall motion abnormalities. There is mild left ventricular hypertrophy.   Left ventricular diastolic parameters  are indeterminate. The average left ventricular global longitudinal strain  is -22.7 %. The global longitudinal strain is normal.   2. Right ventricular systolic function is normal. The right ventricular  size is normal. There is normal pulmonary artery systolic pressure.   3. Left atrial size was mildly dilated.   4. Eccentric MR that runs anteriorally, the eccentricity of the jet may  lead to underestimation of severity. MV/AV VTI ratio is 0.83 suggesting  mild MR. . The mitral valve is abnormal. Mild mitral valve regurgitation.  No evidence of mitral stenosis.   5. The tricuspid valve is abnormal.   6. The aortic valve has an indeterminant number of cusps. Aortic valve  regurgitation is not visualized. No aortic stenosis is present.   7. Aortic dilatation noted. There is mild dilatation of the aortic root,  measuring 41 mm.   8. The inferior vena cava is normal in size with greater than 50%  respiratory variability, suggesting right atrial pressure of 3 mmHg.      Assessment and Plan  1. Persistent Afib/acquired thrombophilia - no symptoms, continue current meds including xarelto for stroke prevention - has never been symptomatic and has been easilty rate controlled, for this reason we never pursued reestablishing sinus rhythm.    2. HTN - at goal, continue current meds  3.Mitral regurgitation - mild by echo, repeat echo 3-5 years         Antoine Poche, M.D.

## 2022-07-31 ENCOUNTER — Other Ambulatory Visit: Payer: Self-pay | Admitting: Family Medicine

## 2022-08-02 ENCOUNTER — Other Ambulatory Visit: Payer: Self-pay | Admitting: Family Medicine

## 2022-08-14 ENCOUNTER — Ambulatory Visit: Payer: 59 | Admitting: Family Medicine

## 2022-08-14 ENCOUNTER — Encounter: Payer: Self-pay | Admitting: Family Medicine

## 2022-08-14 VITALS — BP 105/71 | HR 83 | Temp 97.5°F | Ht 71.0 in | Wt 187.0 lb

## 2022-08-14 DIAGNOSIS — I10 Essential (primary) hypertension: Secondary | ICD-10-CM | POA: Diagnosis not present

## 2022-08-14 DIAGNOSIS — E119 Type 2 diabetes mellitus without complications: Secondary | ICD-10-CM

## 2022-08-14 DIAGNOSIS — M160 Bilateral primary osteoarthritis of hip: Secondary | ICD-10-CM

## 2022-08-14 DIAGNOSIS — I700 Atherosclerosis of aorta: Secondary | ICD-10-CM | POA: Diagnosis not present

## 2022-08-14 DIAGNOSIS — E785 Hyperlipidemia, unspecified: Secondary | ICD-10-CM

## 2022-08-14 LAB — BAYER DCA HB A1C WAIVED: HB A1C (BAYER DCA - WAIVED): 6.3 % — ABNORMAL HIGH (ref 4.8–5.6)

## 2022-08-14 MED ORDER — LANCET DEVICE MISC: 1.0000 | Freq: Three times a day (TID) | 0 refills | Status: AC

## 2022-08-14 MED ORDER — RIVAROXABAN 20 MG PO TABS: 20.0000 mg | ORAL_TABLET | Freq: Every day | ORAL | 2 refills | Status: DC

## 2022-08-14 MED ORDER — LANCETS MISC. MISC: 1.0000 | Freq: Three times a day (TID) | 0 refills | Status: AC

## 2022-08-14 MED ORDER — METFORMIN HCL ER 500 MG PO TB24: ORAL_TABLET | ORAL | 2 refills | Status: DC

## 2022-08-14 MED ORDER — METOPROLOL SUCCINATE ER 50 MG PO TB24: 50.0000 mg | ORAL_TABLET | Freq: Every day | ORAL | 2 refills | Status: DC

## 2022-08-14 MED ORDER — LISINOPRIL 10 MG PO TABS: 10.0000 mg | ORAL_TABLET | Freq: Every day | ORAL | 2 refills | Status: DC

## 2022-08-14 MED ORDER — BLOOD GLUCOSE TEST VI STRP: 1.0000 | ORAL_STRIP | Freq: Three times a day (TID) | 10 refills | Status: AC

## 2022-08-14 MED ORDER — AMLODIPINE BESYLATE 10 MG PO TABS: 10.0000 mg | ORAL_TABLET | Freq: Every day | ORAL | 2 refills | Status: DC

## 2022-08-14 MED ORDER — BLOOD GLUCOSE MONITORING SUPPL DEVI: 1.0000 | Freq: Three times a day (TID) | 0 refills | Status: AC

## 2022-08-14 MED ORDER — PREGABALIN 150 MG PO CAPS: 300.0000 mg | ORAL_CAPSULE | Freq: Every day | ORAL | 1 refills | Status: AC

## 2022-08-14 NOTE — Progress Notes (Signed)
Subjective:  Patient ID: [name],  male    DOB: 28-Sep-1950  Age: 72 y.o.    CC: Medical Management of Chronic Issues   HPI [name] presents for  follow-up of hypertension. Patient has no history of headache chest pain or shortness of breath or recent cough. Patient also denies symptoms of TIA such as numbness weakness lateralizing. Patient denies side effects from medication. States taking it regularly.   Follow-up of diabetes. Patient does not yetr check blood sugar at home. No significant hypoglycemic spells noted. Medications reviewed. Pt reports taking them regularly. Pt. denies complication/adverse reaction today.  Working on new lifestyle changes including decreasing sodas.   History [name] has a past medical history of Hypertension and Shingles.   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  .Changing from sodas to  His family history is not on file.He reports that he has never smoked. He has never used smokeless tobacco. He reports that he does not drink alcohol and does not use drugs.  Current Outpatient Medications on File Prior to Visit  Medication Sig Dispense Refill   Butalbital-APAP-Caffeine 50-300-40 MG CAPS Take 1 tablet by mouth every 6 (six) hours as needed. 30 capsule 2   prednisoLONE acetate (PRED FORTE) 1 % ophthalmic suspension Place 1 drop into the left eye every other day.     valACYclovir (VALTREX) 1000 MG tablet Take 1,000 mg by mouth daily.     No current facility-administered medications on file prior to visit.    ROS Review of Systems  Constitutional:  Negative for fever.  Respiratory:  Negative for shortness of breath.   Cardiovascular:  Negative for chest pain.  Musculoskeletal:  Negative for arthralgias.  Skin:  Negative for rash.    Objective:  BP 105/71   Pulse 83   Temp (!) 97.5 F (36.4 C)   Ht 5\' 11"  (1.803 m)   Wt 187 lb (84.8 kg)   SpO2 97%   BMI 26.08  kg/m   BP Readings from Last 3 Encounters:  08/14/22 105/71  07/30/22 128/78  05/08/22 116/70    Wt Readings from Last 3 Encounters:  08/14/22 187 lb (84.8 kg)  07/30/22 197 lb 3.2 oz (89.4 kg)  05/08/22 200 lb (90.7 kg)     Physical Exam Vitals reviewed.  Constitutional:      Appearance: He is well-developed.  HENT:     Head: Normocephalic and atraumatic.     Right Ear: External ear normal.     Left Ear: External ear normal.     Mouth/Throat:     Pharynx: No oropharyngeal exudate or posterior oropharyngeal erythema.  Eyes:     Pupils: Pupils are equal, round, and reactive to light.  Cardiovascular:     Rate and Rhythm: Normal rate and regular rhythm.     Heart sounds: No murmur heard. Pulmonary:     Effort: No respiratory distress.     Breath sounds: Normal breath sounds.  Musculoskeletal:     Cervical back: Normal range of motion and neck supple.  Neurological:     Mental Status: He is alert and oriented to person, place, and time.     Diabetic Foot Exam - Simple   No data filed     Lab Results  Component Value Date   HGBA1C 6.5 (H) 05/08/2022   HGBA1C 6.0 (H) 11/06/2021   HGBA1C 6.1 (H) 02/12/2021    Assessment & Plan:   [name]  was seen today for medical management of chronic issues.  Diagnoses and all orders for this visit:  New onset type 2 diabetes mellitus (HCC) -     Bayer DCA Hb A1c Waived -     Microalbumin / creatinine urine ratio  Essential hypertension -     CBC with Differential/Platelet -     CMP14+EGFR -     amLODipine (NORVASC) 10 MG tablet; Take 1 tablet (10 mg total) by mouth daily. -     lisinopril (ZESTRIL) 10 MG tablet; Take 1 tablet (10 mg total) by mouth daily.  Hyperlipidemia, unspecified hyperlipidemia type -     Lipid panel  Aortic atherosclerosis (HCC)  Primary osteoarthritis of both hips  Primary hypertension  Other orders -     metFORMIN (GLUCOPHAGE-XR) 500 MG 24 hr tablet; TAKE 1 TABLET BY MOUTH EVERY DAY  WITH BREAKFAST -     metoprolol succinate (TOPROL-XL) 50 MG 24 hr tablet; Take 1 tablet (50 mg total) by mouth daily. For blood pressure control -     rivaroxaban (XARELTO) 20 MG TABS tablet; Take 1 tablet (20 mg total) by mouth daily with supper. -     Blood Glucose Monitoring Suppl DEVI; 1 each by Does not apply route in the morning, at noon, and at bedtime. May substitute to any manufacturer covered by patient's insurance. -     Glucose Blood (BLOOD GLUCOSE TEST STRIPS) STRP; 1 each by In Vitro route in the morning, at noon, and at bedtime. May substitute to any manufacturer covered by patient's insurance. -     Lancet Device MISC; 1 each by Does not apply route in the morning, at noon, and at bedtime. May substitute to any manufacturer covered by patient's insurance. -     Lancets Misc. MISC; 1 each by Does not apply route in the morning, at noon, and at bedtime. May substitute to any manufacturer covered by patient's insurance. -     pregabalin (LYRICA) 150 MG capsule; Take 2 capsules (300 mg total) by mouth at bedtime.   I have changed [name] [name]'s Xarelto to rivaroxaban. I am also having him start on Blood Glucose Monitoring Suppl, BLOOD GLUCOSE TEST STRIPS, Lancet Device, and Lancets Misc.. Additionally, I am having him maintain his Butalbital-APAP-Caffeine, prednisoLONE acetate, valACYclovir, amLODipine, lisinopril, metFORMIN, metoprolol succinate, and pregabalin.  Meds ordered this encounter  Medications   amLODipine (NORVASC) 10 MG tablet    Sig: Take 1 tablet (10 mg total) by mouth daily.    Dispense:  90 tablet    Refill:  2   lisinopril (ZESTRIL) 10 MG tablet    Sig: Take 1 tablet (10 mg total) by mouth daily.    Dispense:  90 tablet    Refill:  2   metFORMIN (GLUCOPHAGE-XR) 500 MG 24 hr tablet    Sig: TAKE 1 TABLET BY MOUTH EVERY DAY WITH BREAKFAST    Dispense:  90 tablet    Refill:  2   metoprolol succinate (TOPROL-XL) 50 MG 24 hr tablet    Sig: Take 1 tablet (50 mg  total) by mouth daily. For blood pressure control    Dispense:  90 tablet    Refill:  2   rivaroxaban (XARELTO) 20 MG TABS tablet    Sig: Take 1 tablet (20 mg total) by mouth daily with supper.    Dispense:  90 tablet    Refill:  2   Blood Glucose Monitoring Suppl DEVI    Sig: 1 each by Does not  apply route in the morning, at noon, and at bedtime. May substitute to any manufacturer covered by patient's insurance.    Dispense:  1 each    Refill:  0   Glucose Blood (BLOOD GLUCOSE TEST STRIPS) STRP    Sig: 1 each by In Vitro route in the morning, at noon, and at bedtime. May substitute to any manufacturer covered by patient's insurance.    Dispense:  100 strip    Refill:  10   Lancet Device MISC    Sig: 1 each by Does not apply route in the morning, at noon, and at bedtime. May substitute to any manufacturer covered by patient's insurance.    Dispense:  1 each    Refill:  0   Lancets Misc. MISC    Sig: 1 each by Does not apply route in the morning, at noon, and at bedtime. May substitute to any manufacturer covered by patient's insurance.    Dispense:  100 each    Refill:  0   pregabalin (LYRICA) 150 MG capsule    Sig: Take 2 capsules (300 mg total) by mouth at bedtime.    Dispense:  180 capsule    Refill:  1     Follow-up: Return in about 3 months (around 11/14/2022).  Mechele Claude, M.D.

## 2022-08-15 LAB — CMP14+EGFR
ALT: 27 IU/L (ref 0–44)
AST: 34 IU/L (ref 0–40)
Albumin: 4.5 g/dL (ref 3.9–4.9)
Alkaline Phosphatase: 105 IU/L (ref 44–121)
BUN/Creatinine Ratio: 15 (ref 10–24)
BUN: 22 mg/dL (ref 8–27)
Bilirubin Total: 0.8 mg/dL (ref 0.0–1.2)
CO2: 18 mmol/L — ABNORMAL LOW (ref 20–29)
Calcium: 10 mg/dL (ref 8.6–10.2)
Chloride: 108 mmol/L — ABNORMAL HIGH (ref 96–106)
Creatinine, Ser: 1.48 mg/dL — ABNORMAL HIGH (ref 0.76–1.27)
Globulin, Total: 2.5 g/dL (ref 1.5–4.5)
Glucose: 113 mg/dL — ABNORMAL HIGH (ref 70–99)
Potassium: 4.1 mmol/L (ref 3.5–5.2)
Sodium: 145 mmol/L — ABNORMAL HIGH (ref 134–144)
Total Protein: 7 g/dL (ref 6.0–8.5)
eGFR: 51 mL/min/{1.73_m2} — ABNORMAL LOW (ref >59)

## 2022-08-15 LAB — LIPID PANEL
Chol/HDL Ratio: 3.4 ratio (ref 0.0–5.0)
Cholesterol, Total: 127 mg/dL (ref 100–199)
HDL: 37 mg/dL — ABNORMAL LOW (ref >39)
LDL Chol Calc (NIH): 74 mg/dL (ref 0–99)
Triglycerides: 82 mg/dL (ref 0–149)
VLDL Cholesterol Cal: 16 mg/dL (ref 5–40)

## 2022-08-15 LAB — MICROALBUMIN / CREATININE URINE RATIO
Creatinine, Urine: 334.1 mg/dL
Microalb/Creat Ratio: 6 mg/g creat (ref 0–29)
Microalbumin, Urine: 19 ug/mL

## 2022-08-15 LAB — CBC WITH DIFFERENTIAL/PLATELET
Basophils Absolute: 0.1 10*3/uL (ref 0.0–0.2)
Basos: 1 %
EOS (ABSOLUTE): 0.1 10*3/uL (ref 0.0–0.4)
Eos: 1 %
Hematocrit: 48 % (ref 37.5–51.0)
Hemoglobin: 16.1 g/dL (ref 13.0–17.7)
Immature Grans (Abs): 0 10*3/uL (ref 0.0–0.1)
Immature Granulocytes: 0 %
Lymphocytes Absolute: 1.6 10*3/uL (ref 0.7–3.1)
Lymphs: 25 %
MCH: 29.8 pg (ref 26.6–33.0)
MCHC: 33.5 g/dL (ref 31.5–35.7)
MCV: 89 fL (ref 79–97)
Monocytes Absolute: 0.6 10*3/uL (ref 0.1–0.9)
Monocytes: 9 %
Neutrophils Absolute: 4.1 10*3/uL (ref 1.4–7.0)
Neutrophils: 64 %
Platelets: 214 10*3/uL (ref 150–450)
RBC: 5.41 x10E6/uL (ref 4.14–5.80)
RDW: 13 % (ref 11.6–15.4)
WBC: 6.4 10*3/uL (ref 3.4–10.8)

## 2022-11-14 ENCOUNTER — Ambulatory Visit: Payer: 59 | Admitting: Family Medicine

## 2022-11-14 ENCOUNTER — Encounter: Payer: Self-pay | Admitting: Family Medicine

## 2022-11-14 VITALS — BP 122/71 | HR 67 | Temp 97.4°F | Ht 71.0 in | Wt 182.4 lb

## 2022-11-14 DIAGNOSIS — Z7984 Long term (current) use of oral hypoglycemic drugs: Secondary | ICD-10-CM

## 2022-11-14 DIAGNOSIS — I10 Essential (primary) hypertension: Secondary | ICD-10-CM

## 2022-11-14 DIAGNOSIS — E785 Hyperlipidemia, unspecified: Secondary | ICD-10-CM | POA: Diagnosis not present

## 2022-11-14 DIAGNOSIS — E119 Type 2 diabetes mellitus without complications: Secondary | ICD-10-CM | POA: Diagnosis not present

## 2022-11-14 LAB — CBC WITH DIFFERENTIAL/PLATELET
Basophils Absolute: 0.1 10*3/uL (ref 0.0–0.2)
Basos: 1 %
EOS (ABSOLUTE): 0.1 10*3/uL (ref 0.0–0.4)
Eos: 2 %
Hematocrit: 48.6 % (ref 37.5–51.0)
Hemoglobin: 16 g/dL (ref 13.0–17.7)
Immature Grans (Abs): 0 10*3/uL (ref 0.0–0.1)
Immature Granulocytes: 0 %
Lymphocytes Absolute: 1.5 10*3/uL (ref 0.7–3.1)
Lymphs: 26 %
MCH: 31.7 pg (ref 26.6–33.0)
MCHC: 32.9 g/dL (ref 31.5–35.7)
MCV: 96 fL (ref 79–97)
Monocytes Absolute: 0.5 10*3/uL (ref 0.1–0.9)
Monocytes: 9 %
Neutrophils Absolute: 3.6 10*3/uL (ref 1.4–7.0)
Neutrophils: 62 %
Platelets: 188 10*3/uL (ref 150–450)
RBC: 5.04 x10E6/uL (ref 4.14–5.80)
RDW: 13.1 % (ref 11.6–15.4)
WBC: 5.8 10*3/uL (ref 3.4–10.8)

## 2022-11-14 LAB — LIPID PANEL
Chol/HDL Ratio: 3 {ratio} (ref 0.0–5.0)
Cholesterol, Total: 126 mg/dL (ref 100–199)
HDL: 42 mg/dL (ref >39)
LDL Chol Calc (NIH): 69 mg/dL (ref 0–99)
Triglycerides: 71 mg/dL (ref 0–149)
VLDL Cholesterol Cal: 15 mg/dL (ref 5–40)

## 2022-11-14 LAB — CMP14+EGFR
ALT: 17 [IU]/L (ref 0–44)
AST: 22 [IU]/L (ref 0–40)
Albumin: 4.3 g/dL (ref 3.9–4.9)
Alkaline Phosphatase: 90 [IU]/L (ref 44–121)
BUN/Creatinine Ratio: 11 (ref 10–24)
BUN: 13 mg/dL (ref 8–27)
Bilirubin Total: 1.2 mg/dL (ref 0.0–1.2)
CO2: 22 mmol/L (ref 20–29)
Calcium: 9.8 mg/dL (ref 8.6–10.2)
Chloride: 108 mmol/L — ABNORMAL HIGH (ref 96–106)
Creatinine, Ser: 1.14 mg/dL (ref 0.76–1.27)
Globulin, Total: 2.5 g/dL (ref 1.5–4.5)
Glucose: 111 mg/dL — ABNORMAL HIGH (ref 70–99)
Potassium: 4.9 mmol/L (ref 3.5–5.2)
Sodium: 146 mmol/L — ABNORMAL HIGH (ref 134–144)
Total Protein: 6.8 g/dL (ref 6.0–8.5)
eGFR: 69 mL/min/{1.73_m2} (ref >59)

## 2022-11-14 LAB — BAYER DCA HB A1C WAIVED: HB A1C (BAYER DCA - WAIVED): 5.2 % (ref 4.8–5.6)

## 2022-11-14 NOTE — Progress Notes (Signed)
Subjective:  Patient ID: [name],  male    DOB: January 22, 1951  Age: 72 y.o.    CC: Medical Management of Chronic Issues   HPI Derric Dealmeida presents for  follow-up of hypertension. Patient has no history of headache chest pain or shortness of breath or recent cough. Patient also denies symptoms of TIA such as numbness weakness lateralizing. Patient denies side effects from medication. States taking it regularly.  Atrial fibrillation follow up. Pt. is treated with rate control and anticoagulation. Pt.  denies palpitations, rapid rate, chest pain, dyspnea and edema. There has been no bleeding from nose or gums. Pt. has not noticed blood with urine or stool.  Although there is routine bruising easily, it is not excessive.  Taking lyrica for PHN. 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 Doing well without complaints on current medication. Denies side effects  including myalgia and arthralgia and nausea. Also in today for liver function testing. Currently no chest pain, shortness of breath or other cardiovascular related symptoms noted.  Follow-up of diabetes. Patient does check blood sugar at home. Log not available Patient denies symptoms such as excessive hunger or urinary frequency, excessive hunger, nausea No significant hypoglycemic spells noted. Medications reviewed. Pt reports taking them regularly. Pt. denies complication/adverse reaction today.    History Hamish has a past medical history of Hypertension and Shingles.   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   His family history is not on file.He reports that he has never smoked. He has never used smokeless tobacco. He reports that he does not drink alcohol and does not use drugs.  Current Outpatient Medications on File Prior to Visit  Medication Sig Dispense Refill   amLODipine (NORVASC) 10  MG tablet Take 1 tablet (10 mg total) by mouth daily. 90 tablet 2   Blood Glucose Monitoring Suppl DEVI 1 each by Does not apply route in the morning, at noon, and at bedtime. May substitute to any manufacturer covered by patient's insurance. 1 each 0   Butalbital-APAP-Caffeine 50-300-40 MG CAPS Take 1 tablet by mouth every 6 (six) hours as needed. 30 capsule 2   Glucose Blood (BLOOD GLUCOSE TEST STRIPS) STRP 1 each by In Vitro route in the morning, at noon, and at bedtime. May substitute to any manufacturer covered by patient's insurance. 100 strip 10   lisinopril (ZESTRIL) 10 MG tablet Take 1 tablet (10 mg total) by mouth daily. 90 tablet 2   metFORMIN (GLUCOPHAGE-XR) 500 MG 24 hr tablet TAKE 1 TABLET BY MOUTH EVERY DAY WITH BREAKFAST 90 tablet 2   metoprolol succinate (TOPROL-XL) 50 MG 24 hr tablet Take 1 tablet (50 mg total) by mouth daily. For blood pressure control 90 tablet 2   prednisoLONE acetate (PRED FORTE) 1 % ophthalmic suspension Place 1 drop into the left eye every other day.     pregabalin (LYRICA) 150 MG capsule Take 2 capsules (300 mg total) by mouth at bedtime. 180 capsule 1   rivaroxaban (XARELTO) 20 MG TABS tablet Take 1 tablet (20 mg total) by mouth daily with supper. 90 tablet 2   valACYclovir (VALTREX) 1000 MG tablet Take 1,000 mg by mouth daily.     No current facility-administered medications on file prior to visit.    ROS Review of Systems  Constitutional:  Negative for fever.  Respiratory:  Negative for shortness of breath.  Cardiovascular:  Negative for chest pain.  Musculoskeletal:  Negative for arthralgias.  Skin:  Negative for rash.    Objective:  BP 122/71   Pulse 67   Temp (!) 97.4 F (36.3 C)   Ht 5\' 11"  (1.803 m)   Wt 182 lb 6.4 oz (82.7 kg)   SpO2 97%   BMI 25.44 kg/m   BP Readings from Last 3 Encounters:  11/14/22 122/71  08/14/22 105/71  07/30/22 128/78    Wt Readings from Last 3 Encounters:  11/14/22 182 lb 6.4 oz (82.7 kg)  08/14/22  187 lb (84.8 kg)  07/30/22 197 lb 3.2 oz (89.4 kg)     Physical Exam Vitals reviewed.  Constitutional:      Appearance: He is well-developed.  HENT:     Head: Normocephalic and atraumatic.     Right Ear: External ear normal.     Left Ear: External ear normal.     Mouth/Throat:     Pharynx: No oropharyngeal exudate or posterior oropharyngeal erythema.  Eyes:     Pupils: Pupils are equal, round, and reactive to light.  Cardiovascular:     Rate and Rhythm: Normal rate and regular rhythm.     Heart sounds: No murmur heard. Pulmonary:     Effort: No respiratory distress.     Breath sounds: Normal breath sounds.  Musculoskeletal:     Cervical back: Normal range of motion and neck supple.  Neurological:     Mental Status: He is alert and oriented to person, place, and time.     Diabetic Foot Exam - Simple   No data filed     Lab Results  Component Value Date   HGBA1C 6.3 (H) 08/14/2022   HGBA1C 6.5 (H) 05/08/2022   HGBA1C 6.0 (H) 11/06/2021    Assessment & Plan:   Tonya was seen today for medical management of chronic issues.  Diagnoses and all orders for this visit:  New onset type 2 diabetes mellitus (HCC) -     Bayer DCA Hb A1c Waived  Essential hypertension -     CBC with Differential/Platelet -     CMP14+EGFR  Hyperlipidemia, unspecified hyperlipidemia type -     Lipid panel   I am having [name] maintain his Butalbital-APAP-Caffeine, prednisoLONE acetate, valACYclovir, amLODipine, lisinopril, metFORMIN, metoprolol succinate, rivaroxaban, Blood Glucose Monitoring Suppl, BLOOD GLUCOSE TEST STRIPS, and pregabalin.  No orders of the defined types were placed in this encounter.  Trying to Cut out sodas. Very hard for him.   Follow-up: Return in about 3 months (around 02/14/2023).  Mechele Claude, M.D.

## 2022-11-17 NOTE — Progress Notes (Signed)
Hello Jerico,  Your lab result is normal and/or stable.Some minor variations that are not significant are commonly marked abnormal, but do not represent any medical problem for you.  Best regards, Mechele Claude, M.D.

## 2023-02-18 ENCOUNTER — Ambulatory Visit: Payer: Medicare Other | Admitting: Family Medicine

## 2023-03-03 ENCOUNTER — Ambulatory Visit: Payer: 59 | Admitting: Family Medicine

## 2023-03-18 ENCOUNTER — Encounter: Payer: Self-pay | Admitting: Family Medicine

## 2023-03-18 ENCOUNTER — Ambulatory Visit: Payer: 59 | Admitting: Family Medicine

## 2023-03-18 VITALS — BP 114/69 | HR 69 | Ht 71.0 in | Wt 181.0 lb

## 2023-03-18 DIAGNOSIS — E119 Type 2 diabetes mellitus without complications: Secondary | ICD-10-CM

## 2023-03-18 DIAGNOSIS — I700 Atherosclerosis of aorta: Secondary | ICD-10-CM

## 2023-03-18 DIAGNOSIS — I480 Paroxysmal atrial fibrillation: Secondary | ICD-10-CM | POA: Diagnosis not present

## 2023-03-18 DIAGNOSIS — I10 Essential (primary) hypertension: Secondary | ICD-10-CM

## 2023-03-18 DIAGNOSIS — M19011 Primary osteoarthritis, right shoulder: Secondary | ICD-10-CM

## 2023-03-18 DIAGNOSIS — Z7984 Long term (current) use of oral hypoglycemic drugs: Secondary | ICD-10-CM

## 2023-03-18 DIAGNOSIS — M19012 Primary osteoarthritis, left shoulder: Secondary | ICD-10-CM

## 2023-03-18 LAB — LIPID PANEL
Chol/HDL Ratio: 3.4 {ratio} (ref 0.0–5.0)
Cholesterol, Total: 141 mg/dL (ref 100–199)
HDL: 41 mg/dL (ref >39)
LDL Chol Calc (NIH): 83 mg/dL (ref 0–99)
Triglycerides: 88 mg/dL (ref 0–149)
VLDL Cholesterol Cal: 17 mg/dL (ref 5–40)

## 2023-03-18 LAB — CBC WITH DIFFERENTIAL/PLATELET
Basophils Absolute: 0.1 10*3/uL (ref 0.0–0.2)
Basos: 1 %
EOS (ABSOLUTE): 0.1 10*3/uL (ref 0.0–0.4)
Eos: 2 %
Hematocrit: 48.5 % (ref 37.5–51.0)
Hemoglobin: 16.6 g/dL (ref 13.0–17.7)
Immature Grans (Abs): 0 10*3/uL (ref 0.0–0.1)
Immature Granulocytes: 0 %
Lymphocytes Absolute: 1.8 10*3/uL (ref 0.7–3.1)
Lymphs: 23 %
MCH: 31.8 pg (ref 26.6–33.0)
MCHC: 34.2 g/dL (ref 31.5–35.7)
MCV: 93 fL (ref 79–97)
Monocytes Absolute: 0.6 10*3/uL (ref 0.1–0.9)
Monocytes: 8 %
Neutrophils Absolute: 5.3 10*3/uL (ref 1.4–7.0)
Neutrophils: 66 %
Platelets: 233 10*3/uL (ref 150–450)
RBC: 5.22 x10E6/uL (ref 4.14–5.80)
RDW: 12.5 % (ref 11.6–15.4)
WBC: 7.9 10*3/uL (ref 3.4–10.8)

## 2023-03-18 LAB — CMP14+EGFR
ALT: 17 [IU]/L (ref 0–44)
AST: 24 [IU]/L (ref 0–40)
Albumin: 4.3 g/dL (ref 3.9–4.9)
Alkaline Phosphatase: 92 [IU]/L (ref 44–121)
BUN/Creatinine Ratio: 10 (ref 10–24)
BUN: 11 mg/dL (ref 8–27)
Bilirubin Total: 1.2 mg/dL (ref 0.0–1.2)
CO2: 23 mmol/L (ref 20–29)
Calcium: 9.9 mg/dL (ref 8.6–10.2)
Chloride: 106 mmol/L (ref 96–106)
Creatinine, Ser: 1.12 mg/dL (ref 0.76–1.27)
Globulin, Total: 2.4 g/dL (ref 1.5–4.5)
Glucose: 102 mg/dL — ABNORMAL HIGH (ref 70–99)
Potassium: 4 mmol/L (ref 3.5–5.2)
Sodium: 145 mmol/L — ABNORMAL HIGH (ref 134–144)
Total Protein: 6.7 g/dL (ref 6.0–8.5)
eGFR: 71 mL/min/{1.73_m2} (ref >59)

## 2023-03-18 LAB — BAYER DCA HB A1C WAIVED: HB A1C (BAYER DCA - WAIVED): 5.8 % — ABNORMAL HIGH (ref 4.8–5.6)

## 2023-03-18 NOTE — Progress Notes (Signed)
Subjective:  Patient ID: [name], male    DOB: [birth year]  Age: 73 y.o. MRN: [id number]  CC: Diabetes (Follow up/Labs taken)   HPI [name] presents forFollow-up of diabetes. Patient checks blood sugar at home.   90 - 178fasting and  postprandial Patient denies symptoms such as polyuria, polydipsia, excessive hunger, nausea No significant hypoglycemic spells noted. Medications reviewed. Pt reports taking them regularly without complication/adverse reaction being reported today.    History Bryer has a past medical history of Hypertension and Shingles.   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   His family history is not on file.He reports that he has never smoked. He has never used smokeless tobacco. He reports that he does not drink alcohol and does not use drugs.  Current Outpatient Medications on File Prior to Visit  Medication Sig Dispense Refill   amLODipine (NORVASC) 10 MG tablet Take 1 tablet (10 mg total) by mouth daily. 90 tablet 2   Blood Glucose Monitoring Suppl DEVI 1 each by Does not apply route in the morning, at noon, and at bedtime. May substitute to any manufacturer covered by patient's insurance. 1 each 0   Glucose Blood (BLOOD GLUCOSE TEST STRIPS) STRP 1 each by In Vitro route in the morning, at noon, and at bedtime. May substitute to any manufacturer covered by patient's insurance. 100 strip 10   lisinopril (ZESTRIL) 10 MG tablet Take 1 tablet (10 mg total) by mouth daily. 90 tablet 2   metFORMIN (GLUCOPHAGE-XR) 500 MG 24 hr tablet TAKE 1 TABLET BY MOUTH EVERY DAY WITH BREAKFAST 90 tablet 2   metoprolol succinate (TOPROL-XL) 50 MG 24 hr tablet Take 1 tablet (50 mg total) by mouth daily. For blood pressure control 90 tablet 2   prednisoLONE acetate (PRED FORTE) 1 % ophthalmic suspension Place 1 drop into the left eye every other day.     pregabalin (LYRICA) 150 MG capsule Take 2 capsules  (300 mg total) by mouth at bedtime. 180 capsule 1   rivaroxaban (XARELTO) 20 MG TABS tablet Take 1 tablet (20 mg total) by mouth daily with supper. 90 tablet 2   valACYclovir (VALTREX) 1000 MG tablet Take 1,000 mg by mouth daily.     No current facility-administered medications on file prior to visit.    ROS Review of Systems  Constitutional:  Negative for fever.  Respiratory:  Negative for shortness of breath.   Cardiovascular:  Negative for chest pain.  Musculoskeletal:  Positive for arthralgias (both shoullders).  Skin:  Negative for rash.    Objective:  BP 114/69   Pulse 69   Ht 5\' 11"  (1.803 m)   Wt 181 lb (82.1 kg)   SpO2 97%   BMI 25.24 kg/m   BP Readings from Last 3 Encounters:  03/18/23 114/69  11/14/22 122/71  08/14/22 105/71    Wt Readings from Last 3 Encounters:  03/18/23 181 lb (82.1 kg)  11/14/22 182 lb 6.4 oz (82.7 kg)  08/14/22 187 lb (84.8 kg)     Physical Exam Vitals reviewed.  Constitutional:      Appearance: He is well-developed.  HENT:     Head: Normocephalic and atraumatic.     Right Ear: External ear normal.     Left Ear: External ear normal.     Mouth/Throat:     Pharynx: No oropharyngeal exudate or posterior oropharyngeal erythema.  Eyes:     Pupils: Pupils are equal, round,  and reactive to light.  Cardiovascular:     Rate and Rhythm: Normal rate and regular rhythm.     Heart sounds: No murmur heard. Pulmonary:     Effort: No respiratory distress.     Breath sounds: Normal breath sounds.  Musculoskeletal:     Cervical back: Normal range of motion and neck supple.  Neurological:     Mental Status: He is alert and oriented to person, place, and time.       Assessment & Plan:   Eli was seen today for diabetes.  Diagnoses and all orders for this visit:  New onset type 2 diabetes mellitus (HCC) -     Bayer DCA Hb A1c Waived  Essential hypertension -     CBC with Differential/Platelet  Aortic atherosclerosis (HCC) -      Bayer DCA Hb A1c Waived -     CMP14+EGFR -     Lipid panel  Paroxysmal atrial fibrillation (HCC) -     CBC with Differential/Platelet -     CMP14+EGFR -     Lipid panel  Primary osteoarthritis of both shoulders      I have discontinued [employer]. I am also having him maintain his prednisoLONE acetate, valACYclovir, amLODipine, lisinopril, metFORMIN, metoprolol succinate, rivaroxaban, Blood Glucose Monitoring Suppl, BLOOD GLUCOSE TEST STRIPS, and pregabalin.  No orders of the defined types were placed in this encounter.    Follow-up: Return in about 6 months (around 09/15/2023).  Mechele Claude, M.D.

## 2023-03-21 ENCOUNTER — Encounter: Payer: Self-pay | Admitting: Family Medicine

## 2023-06-07 ENCOUNTER — Other Ambulatory Visit: Payer: Self-pay | Admitting: Family Medicine

## 2023-07-11 ENCOUNTER — Other Ambulatory Visit: Payer: Self-pay | Admitting: Family Medicine

## 2023-07-13 ENCOUNTER — Other Ambulatory Visit: Payer: Self-pay | Admitting: Family Medicine

## 2023-07-13 DIAGNOSIS — I10 Essential (primary) hypertension: Secondary | ICD-10-CM

## 2023-08-28 ENCOUNTER — Other Ambulatory Visit: Payer: Self-pay | Admitting: Family Medicine

## 2023-09-15 ENCOUNTER — Ambulatory Visit: Payer: Self-pay | Admitting: Family Medicine

## 2023-09-15 ENCOUNTER — Encounter: Payer: Self-pay | Admitting: Family Medicine

## 2023-09-15 ENCOUNTER — Ambulatory Visit: Payer: 59 | Admitting: Family Medicine

## 2023-09-15 VITALS — BP 123/79 | HR 78 | Temp 97.7°F | Ht 71.0 in

## 2023-09-15 DIAGNOSIS — I10 Essential (primary) hypertension: Secondary | ICD-10-CM

## 2023-09-15 DIAGNOSIS — E119 Type 2 diabetes mellitus without complications: Secondary | ICD-10-CM

## 2023-09-15 DIAGNOSIS — Z7984 Long term (current) use of oral hypoglycemic drugs: Secondary | ICD-10-CM | POA: Diagnosis not present

## 2023-09-15 DIAGNOSIS — E785 Hyperlipidemia, unspecified: Secondary | ICD-10-CM | POA: Diagnosis not present

## 2023-09-15 DIAGNOSIS — R17 Unspecified jaundice: Secondary | ICD-10-CM

## 2023-09-15 LAB — BAYER DCA HB A1C WAIVED: HB A1C (BAYER DCA - WAIVED): 5.9 % — ABNORMAL HIGH (ref 4.8–5.6)

## 2023-09-15 MED ORDER — LISINOPRIL 10 MG PO TABS: 10.0000 mg | ORAL_TABLET | Freq: Every day | ORAL | 3 refills | Status: AC

## 2023-09-15 MED ORDER — AMLODIPINE BESYLATE 10 MG PO TABS: 10.0000 mg | ORAL_TABLET | Freq: Every day | ORAL | 3 refills | Status: AC

## 2023-09-15 MED ORDER — METFORMIN HCL ER 500 MG PO TB24: ORAL_TABLET | ORAL | 3 refills | Status: AC

## 2023-09-15 NOTE — Progress Notes (Signed)
 Subjective:  Patient ID: [name],  male    DOB: [birth year]  Age: 74 y.o.    CC: Medical Management of Chronic Issues   HPI [name] presents for  follow-up of hypertension. Patient has no history of headache chest pain or shortness of breath or recent cough. Patient also denies symptoms of TIA such as numbness weakness lateralizing. Patient denies side effects from medication. States taking it regularly.  Patient also  in for follow-up of elevated cholesterol. Doing well without complaints on current medication. Denies side effects  including myalgia and arthralgia and nausea. Also in today for liver function testing. Currently no chest pain, shortness of breath or other cardiovascular related symptoms noted.  Follow-up of diabetes. Patient does check blood sugar at home. Readings run between 98 and 120 Patient denies symptoms such as excessive hunger or urinary frequency, excessive hunger, nausea No significant hypoglycemic spells noted. Medications reviewed. Pt reports taking them regularly. Pt. denies complication/adverse reaction today.    History [name] has a past medical history of Hypertension and Shingles.   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   His family history is not on file.He reports that he has never smoked. He has never used smokeless tobacco. He reports that he does not drink alcohol and does not use drugs.  Current Outpatient Medications on File Prior to Visit  Medication Sig Dispense Refill   Blood Glucose Monitoring Suppl DEVI 1 each by Does not apply route in the morning, at noon, and at bedtime. May substitute to any manufacturer covered by patient's insurance. 1 each 0   metoprolol  succinate (TOPROL -XL) 50 MG 24 hr tablet TAKE 1 TABLET (50 MG TOTAL) BY MOUTH DAILY. FOR BLOOD PRESSURE CONTROL 90 tablet 2   prednisoLONE acetate (PRED FORTE) 1 % ophthalmic suspension Place 1 drop  into the left eye every other day.     pregabalin  (LYRICA ) 150 MG capsule Take 2 capsules (300 mg total) by mouth at bedtime. 180 capsule 1   rivaroxaban  (XARELTO ) 20 MG TABS tablet TAKE 1 TABLET BY MOUTH DAILY WITH SUPPER. 90 tablet 0   valACYclovir  (VALTREX ) 1000 MG tablet Take 1,000 mg by mouth daily.     No current facility-administered medications on file prior to visit.    ROS Review of Systems  Constitutional:  Negative for fever.  Respiratory:  Negative for shortness of breath.   Cardiovascular:  Negative for chest pain.  Musculoskeletal:  Negative for arthralgias.  Skin:  Negative for rash.    Objective:  BP 123/79   Pulse 78   Temp 97.7 F (36.5 C)   Ht 5' 11 (1.803 m)   SpO2 98%   BMI 25.24 kg/m   BP Readings from Last 3 Encounters:  09/15/23 123/79  03/18/23 114/69  11/14/22 122/71    Wt Readings from Last 3 Encounters:  03/18/23 181 lb (82.1 kg)  11/14/22 182 lb 6.4 oz (82.7 kg)  08/14/22 187 lb (84.8 kg)    Lab Results  Component Value Date   HGBA1C 5.8 (H) 03/18/2023   HGBA1C 5.2 11/14/2022   HGBA1C 6.3 (H) 08/14/2022    Physical Exam Vitals reviewed.  Constitutional:      Appearance: He is well-developed.  HENT:     Head: Normocephalic and atraumatic.     Right Ear: External ear normal.     Left Ear: External ear normal.     Mouth/Throat:     Pharynx:  No oropharyngeal exudate or posterior oropharyngeal erythema.  Eyes:     Pupils: Pupils are equal, round, and reactive to light.  Cardiovascular:     Rate and Rhythm: Normal rate and regular rhythm.     Heart sounds: No murmur heard. Pulmonary:     Effort: No respiratory distress.     Breath sounds: Normal breath sounds.  Musculoskeletal:     Cervical back: Normal range of motion and neck supple.  Neurological:     Mental Status: He is alert and oriented to person, place, and time.         Assessment & Plan:  New onset type 2 diabetes mellitus (HCC) -     Bayer DCA Hb A1c  Waived -     CBC with Differential/Platelet -     CMP14+EGFR -     Microalbumin / creatinine urine ratio  Essential hypertension -     CMP14+EGFR -     amLODIPine  Besylate; Take 1 tablet (10 mg total) by mouth daily.  Dispense: 90 tablet; Refill: 3 -     Lisinopril ; Take 1 tablet (10 mg total) by mouth daily.  Dispense: 90 tablet; Refill: 3  Hyperlipidemia, unspecified hyperlipidemia type -     Lipid panel  Other orders -     metFORMIN  HCl ER; TAKE 1 TABLET BY MOUTH EVERY DAY WITH BREAKFAST  Dispense: 90 tablet; Refill: 3    Follow-up: Return in about 6 months (around 03/17/2024) for [facility] physical.  Butler Der, M.D.

## 2023-09-16 LAB — CMP14+EGFR
ALT: 26 IU/L (ref 0–44)
AST: 36 IU/L (ref 0–40)
Albumin: 4.7 g/dL (ref 3.9–4.9)
Alkaline Phosphatase: 94 IU/L (ref 44–121)
BUN/Creatinine Ratio: 13 (ref 10–24)
BUN: 19 mg/dL (ref 8–27)
Bilirubin Total: 1.7 mg/dL — ABNORMAL HIGH (ref 0.0–1.2)
CO2: 20 mmol/L (ref 20–29)
Calcium: 10.7 mg/dL — ABNORMAL HIGH (ref 8.6–10.2)
Chloride: 106 mmol/L (ref 96–106)
Creatinine, Ser: 1.48 mg/dL — ABNORMAL HIGH (ref 0.76–1.27)
Globulin, Total: 2.7 g/dL (ref 1.5–4.5)
Glucose: 99 mg/dL (ref 70–99)
Potassium: 4.8 mmol/L (ref 3.5–5.2)
Sodium: 144 mmol/L (ref 134–144)
Total Protein: 7.4 g/dL (ref 6.0–8.5)
eGFR: 51 mL/min/1.73 — ABNORMAL LOW (ref >59)

## 2023-09-16 LAB — CBC WITH DIFFERENTIAL/PLATELET
Basophils Absolute: 0.1 x10E3/uL (ref 0.0–0.2)
Basos: 1 %
EOS (ABSOLUTE): 0.1 x10E3/uL (ref 0.0–0.4)
Eos: 1 %
Hematocrit: 52.2 % — ABNORMAL HIGH (ref 37.5–51.0)
Hemoglobin: 17.4 g/dL (ref 13.0–17.7)
Immature Grans (Abs): 0 x10E3/uL (ref 0.0–0.1)
Immature Granulocytes: 0 %
Lymphocytes Absolute: 1.6 x10E3/uL (ref 0.7–3.1)
Lymphs: 22 %
MCH: 31.5 pg (ref 26.6–33.0)
MCHC: 33.3 g/dL (ref 31.5–35.7)
MCV: 94 fL (ref 79–97)
Monocytes Absolute: 0.7 x10E3/uL (ref 0.1–0.9)
Monocytes: 9 %
Neutrophils Absolute: 4.6 x10E3/uL (ref 1.4–7.0)
Neutrophils: 67 %
Platelets: 220 x10E3/uL (ref 150–450)
RBC: 5.53 x10E6/uL (ref 4.14–5.80)
RDW: 13.4 % (ref 11.6–15.4)
WBC: 7 x10E3/uL (ref 3.4–10.8)

## 2023-09-16 LAB — LIPID PANEL
Chol/HDL Ratio: 3.6 ratio (ref 0.0–5.0)
Cholesterol, Total: 144 mg/dL (ref 100–199)
HDL: 40 mg/dL (ref >39)
LDL Chol Calc (NIH): 87 mg/dL (ref 0–99)
Triglycerides: 87 mg/dL (ref 0–149)
VLDL Cholesterol Cal: 17 mg/dL (ref 5–40)

## 2023-09-24 NOTE — Progress Notes (Signed)
 Future lab has been placed.

## 2023-10-07 ENCOUNTER — Other Ambulatory Visit

## 2023-10-07 DIAGNOSIS — R17 Unspecified jaundice: Secondary | ICD-10-CM

## 2023-10-07 LAB — CMP14+EGFR
ALT: 19 IU/L (ref 0–44)
AST: 23 IU/L (ref 0–40)
Albumin: 4.4 g/dL (ref 3.9–4.9)
Alkaline Phosphatase: 87 IU/L (ref 44–121)
BUN/Creatinine Ratio: 14 (ref 10–24)
BUN: 17 mg/dL (ref 8–27)
Bilirubin Total: 1.1 mg/dL (ref 0.0–1.2)
CO2: 19 mmol/L — ABNORMAL LOW (ref 20–29)
Calcium: 9.9 mg/dL (ref 8.6–10.2)
Chloride: 108 mmol/L — ABNORMAL HIGH (ref 96–106)
Creatinine, Ser: 1.25 mg/dL (ref 0.76–1.27)
Globulin, Total: 2.2 g/dL (ref 1.5–4.5)
Glucose: 106 mg/dL — ABNORMAL HIGH (ref 70–99)
Potassium: 4.1 mmol/L (ref 3.5–5.2)
Sodium: 144 mmol/L (ref 134–144)
Total Protein: 6.6 g/dL (ref 6.0–8.5)
eGFR: 62 mL/min/1.73 (ref >59)

## 2023-10-12 ENCOUNTER — Ambulatory Visit: Payer: Self-pay | Admitting: Family Medicine

## 2023-10-12 NOTE — Progress Notes (Signed)
Hello [name],  Your lab result is normal and/or stable.Some minor variations that are not significant are commonly marked abnormal, but do not represent any medical problem for you.  Best regards, Mechele Claude, M.D.

## 2023-11-16 IMAGING — CT CT HEAD W/O CM
4 series · 16 of 47 positions shown, 18 images · non-contrast
Comparison: None Available.

CLINICAL DATA: 70-year-old male with acute headache and vomiting.



[Series 2: head wo · axial · 0.42mm/px · z∈[+57,+172]mm · 7 of 31 slices shown, 9 images]
[im 4/31  brain]
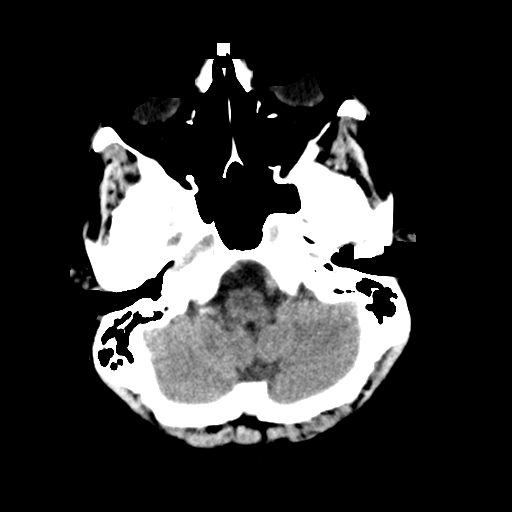
[im 4/31  bone]
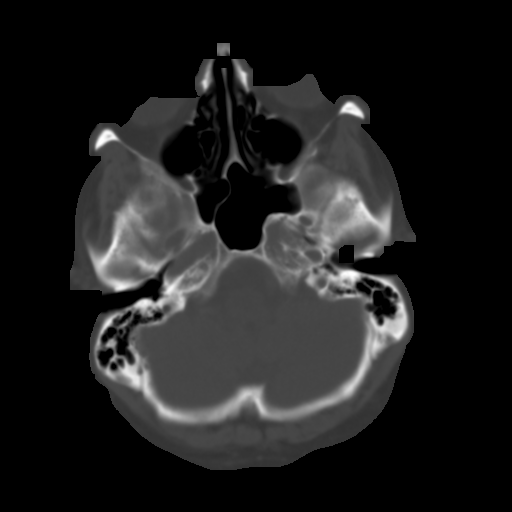
[im 8/31  brain]
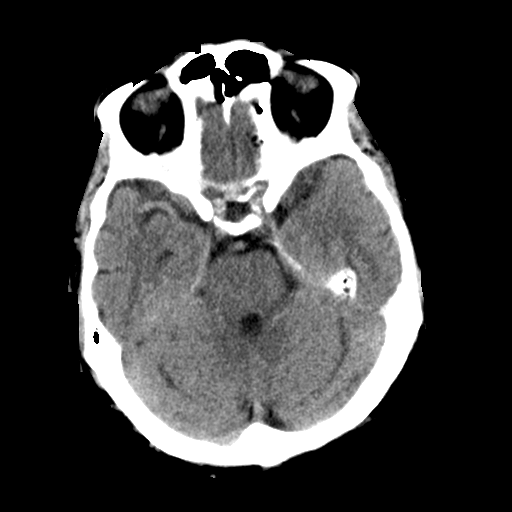
[im 12/31  brain]
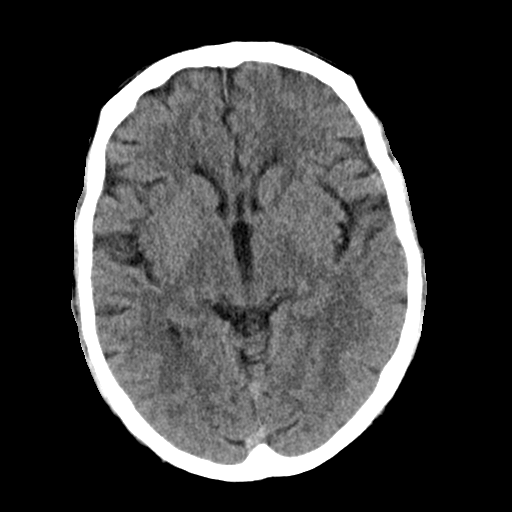
[im 16/31  brain]
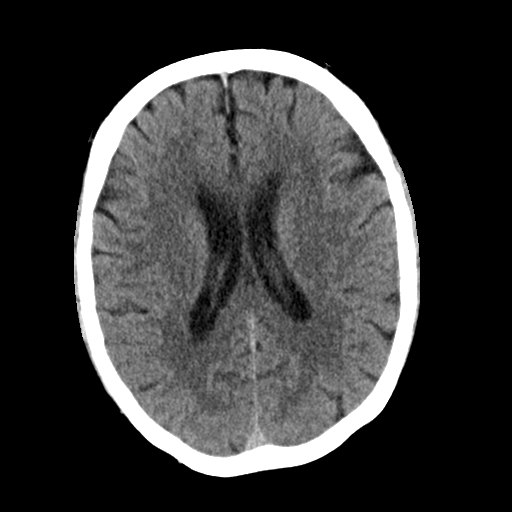
[im 19/31  brain]
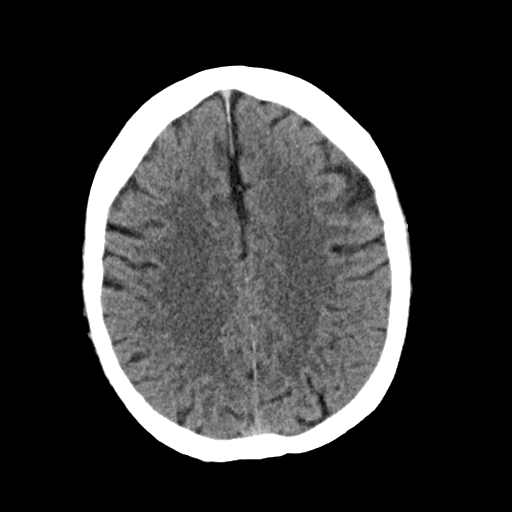
[im 19/31  bone]
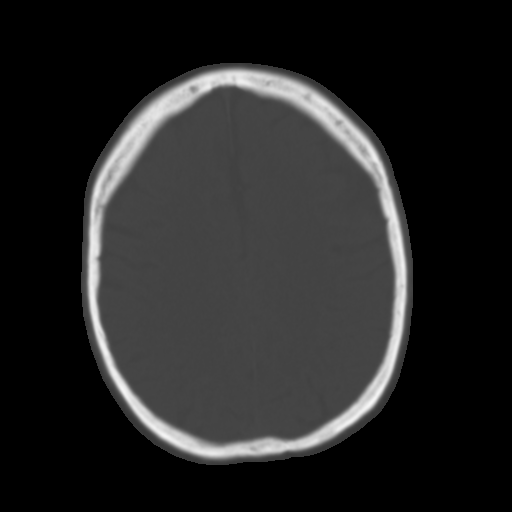
[im 23/31  brain]
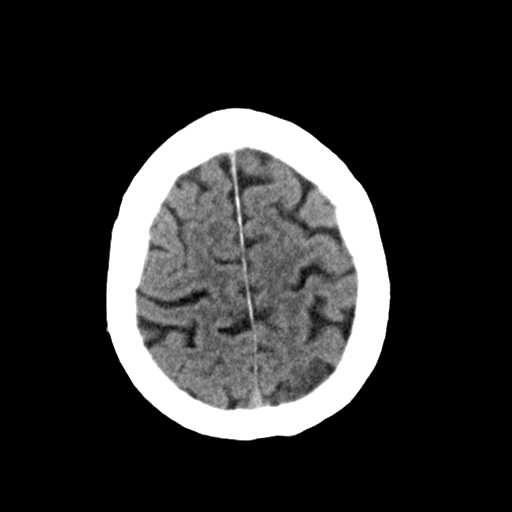
[im 27/31  brain]
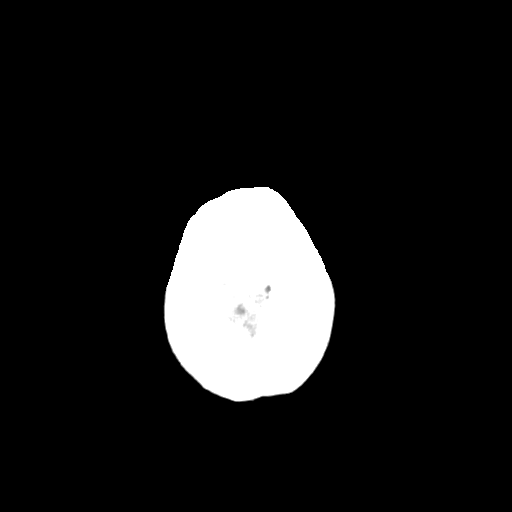

[Series 3: head bone · axial · 0.42mm/px · z∈[+56,+86]mm · 3 of 77 slices shown]
[im 8/77  bone]
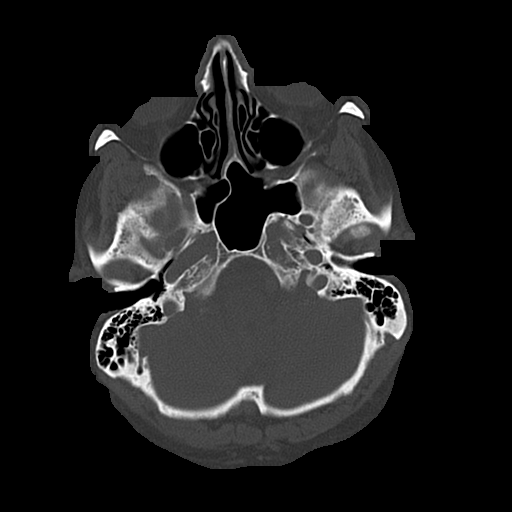
[im 16/77  bone]
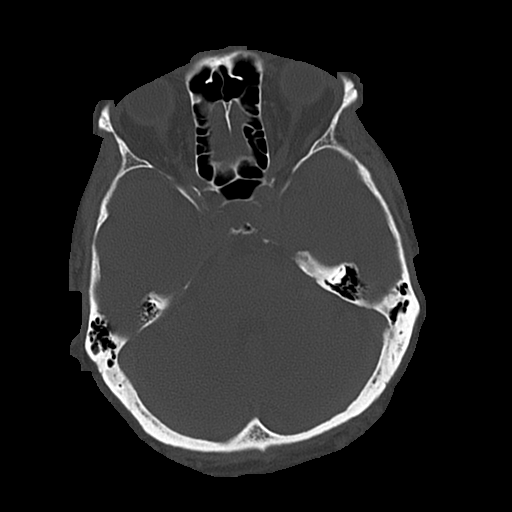
[im 23/77  bone]
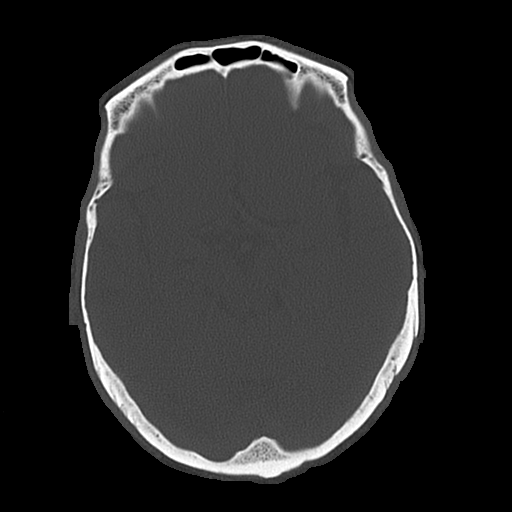

[Series 4: coronal soft · coronal · 0.30mm/px · 3 of 66 slices shown]
[im 22/66  brain]
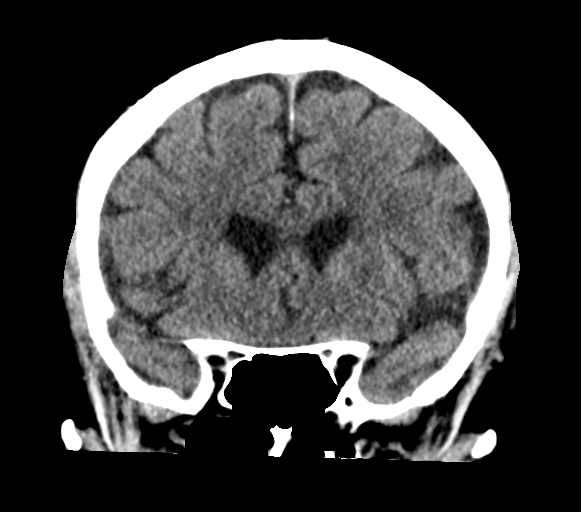
[im 29/66  brain]
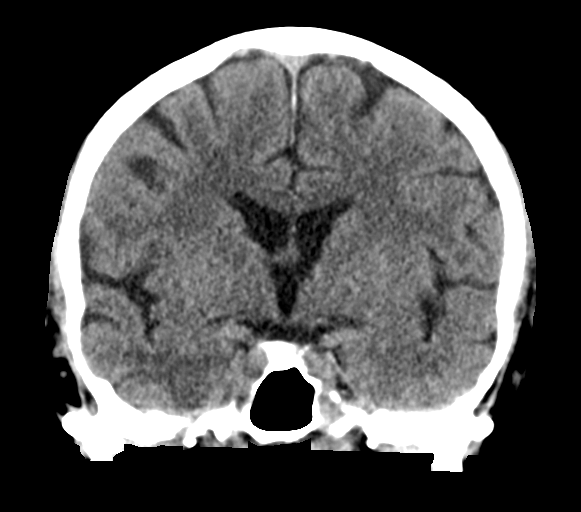
[im 37/66  brain]
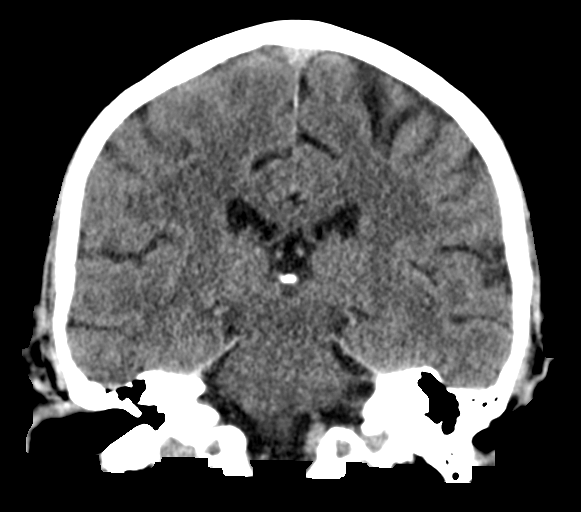

[Series 5: sagittal soft · sagittal · 0.30mm/px · 3 of 55 slices shown]
[im 19/55  brain]
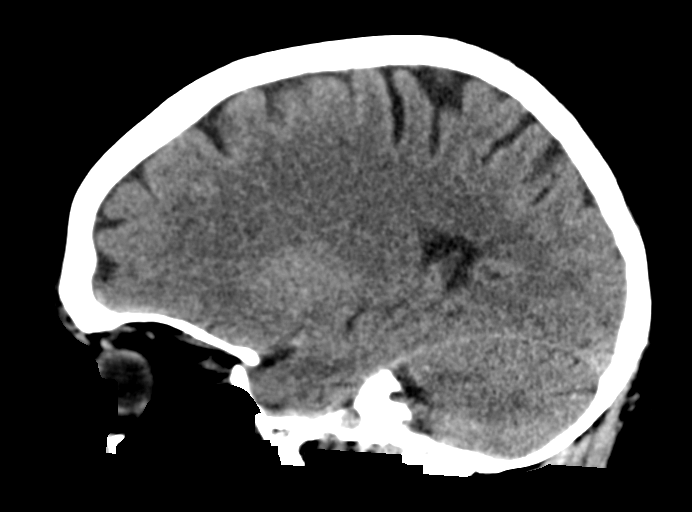
[im 28/55  brain]
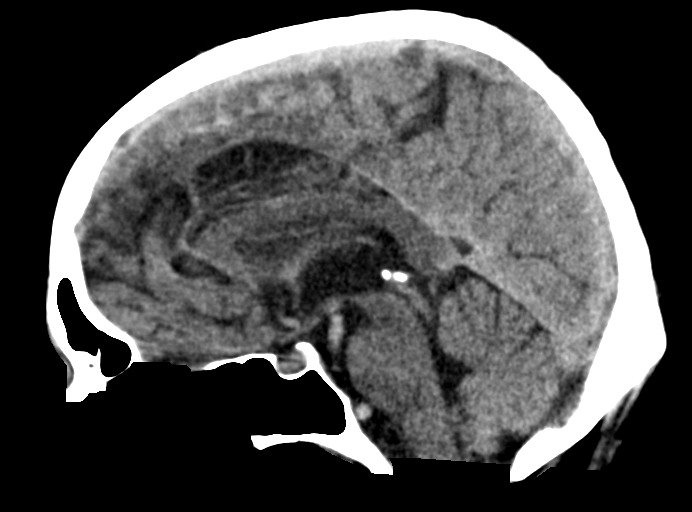
[im 37/55  brain]
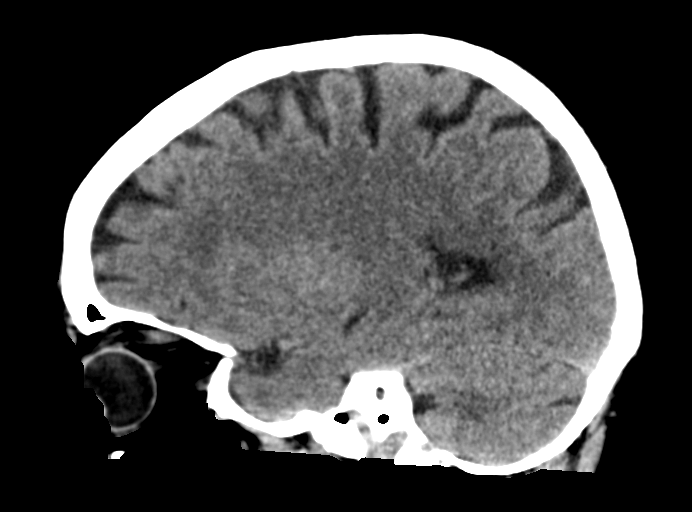

[16 of 47 positions shown; findings below may reference images not displayed]

FINDINGS: Brain: No evidence of acute infarction, hemorrhage, hydrocephalus,
extra-axial collection or mass lesion/mass effect. Mild probable
chronic periventricular white matter small vessel ischemic changes
noted.

Vascular: Carotid and vertebral atherosclerotic calcifications are
noted.

Skull: Normal. Negative for fracture or focal lesion.

Sinuses/Orbits: Unremarkable

Other: None.
IMPRESSION: 1. No evidence of acute intracranial abnormality.
2. Mild probable chronic periventricular white matter small vessel
ischemic changes.

## 2023-11-23 ENCOUNTER — Other Ambulatory Visit: Payer: Self-pay | Admitting: Family Medicine

## 2023-11-28 ENCOUNTER — Ambulatory Visit: Attending: Cardiology | Admitting: Cardiology

## 2023-11-28 VITALS — BP 106/64 | HR 80 | Ht 71.0 in | Wt 177.2 lb

## 2023-11-28 DIAGNOSIS — I4819 Other persistent atrial fibrillation: Secondary | ICD-10-CM

## 2023-11-28 DIAGNOSIS — I10 Essential (primary) hypertension: Secondary | ICD-10-CM

## 2023-11-28 DIAGNOSIS — I340 Nonrheumatic mitral (valve) insufficiency: Secondary | ICD-10-CM

## 2023-11-28 DIAGNOSIS — D6869 Other thrombophilia: Secondary | ICD-10-CM | POA: Diagnosis not present

## 2023-11-28 NOTE — Patient Instructions (Signed)

## 2023-11-28 NOTE — Progress Notes (Signed)
 Clinical Summary [name] is a 73 y.o.male seen today for follow up of the following medical problems.       1.Persistent Afib - diagnosed during 07/2021 ER visit - has never been symptomatic   - no recent palpitations - compliant with meds, no bleeding on xarelto .    2. HTN -compliant with meds - homes 100s/-[id number]   3. Mitral regurgitation - mild by recent echo   4. Dilated aortic root - 41 mm by echo  5. 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 90 tablet 3   Blood Glucose Monitoring Suppl DEVI 1 each by Does not apply route in the morning, at noon, and at bedtime. May substitute to any manufacturer covered by patient's insurance. 1 each 0   lisinopril  (ZESTRIL ) 10 MG tablet Take 1 tablet (10 mg total) by mouth daily. 90 tablet 3   metFORMIN  (GLUCOPHAGE -XR) 500 MG 24 hr tablet TAKE 1 TABLET BY MOUTH EVERY DAY WITH BREAKFAST 90 tablet 3   metoprolol  succinate (TOPROL -XL) 50 MG 24 hr tablet TAKE 1 TABLET (50 MG TOTAL) BY MOUTH DAILY. FOR BLOOD PRESSURE CONTROL 90 tablet 2   prednisoLONE acetate (PRED FORTE) 1 % ophthalmic suspension Place 1 drop into the left eye every other day.     pregabalin  (LYRICA ) 150 MG capsule Take 2 capsules (300 mg total) by mouth at bedtime. 180 capsule 1   rivaroxaban  (XARELTO ) 20 MG TABS tablet TAKE 1 TABLET BY MOUTH DAILY WITH SUPPER. 90 tablet 1   valACYclovir  (VALTREX ) 1000 MG tablet Take 1,000 mg by mouth daily.     No current facility-administered medications for this visit.     Past Surgical History:  Procedure Laterality Date   NO PAST SURGERIES     TOTAL HIP ARTHROPLASTY Right 06/29/2019   Procedure: RIGHT TOTAL HIP ARTHROPLASTY;  Surgeon:  Anderson Maude ORN, MD;  Location: [facility];  Service: Orthopedics;  Laterality: Right;     No Known Allergies    No family history on file.   Social History [name] reports that he has never smoked. He has never used smokeless tobacco. [name] reports no history of alcohol use.     Physical Examination Today's Vitals   11/28/23 0843  BP: 106/64  Pulse: 80  SpO2: 97%  Weight: 177 lb 3.2 oz (80.4 kg)  Height: 5' 11 (1.803 m)   Body mass index is 24.71 kg/m.  Gen: resting comfortably, no acute distress HEENT: no scleral icterus, pupils equal round and reactive, no palptable cervical adenopathy,  RC:pmmzh, no mrg, no jvd Resp: Clear to auscultation bilaterally GI: abdomen is soft, non-tender, non-distended, normal bowel sounds, no hepatosplenomegaly MSK: extremities are warm, no edema.  Skin: warm, no rash Neuro:  no focal deficits Psych: appropriate affect   Diagnostic Studies 01/2022 echo:  1. Left ventricular ejection fraction, by estimation, is 60 to 65%. The  left ventricle has normal function. The left ventricle has no regional  wall motion abnormalities. There is mild left ventricular hypertrophy.  Left ventricular diastolic parameters  are indeterminate. The average left ventricular global longitudinal strain  is -22.7 %. The global  longitudinal strain is normal.   2. Right ventricular systolic function is normal. The right ventricular  size is normal. There is normal pulmonary artery systolic pressure.   3. Left atrial size was mildly dilated.   4. Eccentric MR that runs anteriorally, the eccentricity of the jet may  lead to underestimation of severity. MV/AV VTI ratio is 0.83 suggesting  mild MR. . The mitral valve is abnormal. Mild mitral valve regurgitation.  No evidence of mitral stenosis.   5. The tricuspid valve is abnormal.   6. The aortic valve has an indeterminant number of cusps. Aortic valve  regurgitation is not visualized. No aortic  stenosis is present.   7. Aortic dilatation noted. There is mild dilatation of the aortic root,  measuring 41 mm.   8. The inferior vena cava is normal in size with greater than 50%  respiratory variability, suggesting right atrial pressure of 3 mmHg.       Assessment and Plan  1. Long standing Persistent Afib/acquired thrombophilia - has never been symptomatic and has been easilty rate controlled, for this reason we never pursued reestablishing sinus rhythm.  - no symptoms, EKG shows rate controlled afib today - continue current meds including xarelto , CrCl is 62, he is on appropriate dosing of 20mg  daily.    2. HTN - his bp is at goal, continue current meds   3.Mitral regurgitation - mild by echo,we will repeat echo late 2026  4. Aortic root dilatation - repeat echo late 2026    F/u 1 year  Dorn PHEBE Ross, M.D.

## 2024-01-14 ENCOUNTER — Other Ambulatory Visit: Payer: Self-pay | Admitting: Family Medicine

## 2024-03-18 ENCOUNTER — Encounter: Payer: Self-pay | Admitting: Family Medicine
